# Patient Record
Sex: Female | Born: 1945
Health system: Southern US, Community
[De-identification: ages and names within clinical notes are randomized; demographics above are authoritative.]

## PROBLEM LIST (undated history)

## (undated) DIAGNOSIS — J449 Chronic obstructive pulmonary disease, unspecified: Secondary | ICD-10-CM

## (undated) DIAGNOSIS — F32A Depression, unspecified: Secondary | ICD-10-CM

## (undated) DIAGNOSIS — M1990 Unspecified osteoarthritis, unspecified site: Secondary | ICD-10-CM

## (undated) DIAGNOSIS — R0600 Dyspnea, unspecified: Secondary | ICD-10-CM

## (undated) DIAGNOSIS — I10 Essential (primary) hypertension: Secondary | ICD-10-CM

## (undated) DIAGNOSIS — F329 Major depressive disorder, single episode, unspecified: Secondary | ICD-10-CM

## (undated) DIAGNOSIS — E785 Hyperlipidemia, unspecified: Secondary | ICD-10-CM

## (undated) HISTORY — PX: DILATION AND CURETTAGE OF UTERUS: SHX78

---
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
## 1973-12-07 HISTORY — PX: APPENDECTOMY: SHX54

## 1973-12-07 HISTORY — PX: TUBAL LIGATION: SHX77

## 1998-06-26 ENCOUNTER — Observation Stay (HOSPITAL_COMMUNITY): Admission: RE | Admit: 1998-06-26 | Discharge: 1998-06-27 | Payer: Self-pay | Admitting: *Deleted

## 1998-12-07 HISTORY — PX: RENAL ARTERY STENT: SHX2321

## 2003-12-08 HISTORY — PX: WRIST SURGERY: SHX841

## 2009-12-07 HISTORY — PX: BACK SURGERY: SHX140

## 2010-10-22 ENCOUNTER — Inpatient Hospital Stay (HOSPITAL_COMMUNITY): Admission: RE | Admit: 2010-10-22 | Discharge: 2010-10-23 | Payer: Self-pay | Admitting: Neurosurgery

## 2011-02-17 LAB — BASIC METABOLIC PANEL
CO2: 28 mEq/L (ref 19–32)
Chloride: 107 mEq/L (ref 96–112)
Creatinine, Ser: 0.99 mg/dL (ref 0.4–1.2)
GFR calc Af Amer: >60 mL/min (ref >60)
Sodium: 140 mEq/L (ref 135–145)

## 2011-02-17 LAB — URINALYSIS, ROUTINE W REFLEX MICROSCOPIC
Bilirubin Urine: NEGATIVE
Glucose, UA: NEGATIVE mg/dL
Ketones, ur: NEGATIVE mg/dL
Protein, ur: NEGATIVE mg/dL
pH: 6 (ref 5.0–8.0)

## 2011-02-17 LAB — CBC
Hemoglobin: 12.8 g/dL (ref 12.0–15.0)
MCH: 31.9 pg (ref 26.0–34.0)
MCV: 94.3 fL (ref 78.0–100.0)
Platelets: 179 10*3/uL (ref 150–400)
RBC: 4.01 MIL/uL (ref 3.87–5.11)
WBC: 8.5 10*3/uL (ref 4.0–10.5)

## 2011-02-17 LAB — URINE MICROSCOPIC-ADD ON

## 2011-02-17 LAB — SURGICAL PCR SCREEN
MRSA, PCR: NEGATIVE
Staphylococcus aureus: NEGATIVE

## 2011-02-17 LAB — PROTIME-INR: Prothrombin Time: 12.8 seconds (ref 11.6–15.2)

## 2011-02-17 LAB — APTT: aPTT: 32 seconds (ref 24–37)

## 2013-08-22 ENCOUNTER — Encounter (HOSPITAL_COMMUNITY): Payer: Self-pay | Admitting: Pharmacy Technician

## 2013-08-22 NOTE — Patient Instructions (Signed)
Your procedure is scheduled on:  Monday, 08/28/13  Report to [facility] at     0930   AM.  Call this number if you have problems the morning of surgery: [phone]   Remember:   Do not eat or drink   After Midnight.  Take these medicines the morning of surgery with A SIP OF WATER: xanax, exforge, and celexa   Do not wear jewelry, make-up or nail polish.  Do not wear lotions, powders, or perfumes. You may wear deodorant.  Do not bring valuables to the hospital.  Contacts, dentures or bridgework may not be worn into surgery.     Patients discharged the day of surgery will not be allowed to drive home.  Name and phone number of your driver: driver  Special Instructions: Use eye drops as directed.   Please read over the following fact sheets that you were given: Pain Booklet, Anesthesia Post-op Instructions and Care and Recovery After Surgery    Cataract Surgery  A cataract is a clouding of the lens of the eye. When a lens becomes cloudy, vision is reduced based on the degree and nature of the clouding. Surgery may be needed to improve vision. Surgery removes the cloudy lens and usually replaces it with a substitute lens (intraocular lens, IOL). LET YOUR EYE DOCTOR KNOW ABOUT:  Allergies to food or medicine.   Medicines taken including herbs, eyedrops, over-the-counter medicines, and creams.   Use of steroids (by mouth or creams).   Previous problems with anesthetics or numbing medicine.   History of bleeding problems or blood clots.   Previous surgery.   Other health problems, including diabetes and kidney problems.   Possibility of pregnancy, if this applies.  RISKS AND COMPLICATIONS  Infection.   Inflammation of the eyeball (endophthalmitis) that can spread to both eyes (sympathetic ophthalmia).   Poor wound healing.   If an IOL is inserted, it can later fall out of proper position. This is very uncommon.   Clouding of the part of your eye that holds an IOL in place.  This is called an "after-cataract." These are uncommon, but easily treated.  BEFORE THE PROCEDURE  Do not eat or drink anything except small amounts of water for 8 to 12 before your surgery, or as directed by your caregiver.   Unless you are told otherwise, continue any eyedrops you have been prescribed.   Talk to your primary caregiver about all other medicines that you take (both prescription and non-prescription). In some cases, you may need to stop or change medicines near the time of your surgery. This is most important if you are taking blood-thinning medicine.Do not stop medicines unless you are told to do so.   Arrange for someone to drive you to and from the procedure.   Do not put contact lenses in either eye on the day of your surgery.  PROCEDURE There is more than one method for safely removing a cataract. Your doctor can explain the differences and help determine which is best for you. Phacoemulsification surgery is the most common form of cataract surgery.  An injection is given behind the eye or eyedrops are given to make this a painless procedure.   A small cut (incision) is made on the edge of the clear, dome-shaped surface that covers the front of the eye (cornea).   A tiny probe is painlessly inserted into the eye. This device gives off ultrasound waves that soften and break up the cloudy center of the lens.  This makes it easier for the cloudy lens to be removed by suction.   An IOL may be implanted.   The normal lens of the eye is covered by a clear capsule. Part of that capsule is intentionally left in the eye to support the IOL.   Your surgeon may or may not use stitches to close the incision.  There are other forms of cataract surgery that require a larger incision and stiches to close the eye. This approach is taken in cases where the doctor feels that the cataract cannot be easily removed using phacoemulsification. AFTER THE PROCEDURE  When an IOL is  implanted, it does not need care. It becomes a permanent part of your eye and cannot be seen or felt.   Your doctor will schedule follow-up exams to check on your progress.   Review your other medicines with your doctor to see which can be resumed after surgery.   Use eyedrops or take medicine as prescribed by your doctor.  Document Released: 11/12/2011 Document Reviewed: 11/09/2011 [facility] Patient Information 2012 [place], [place].  PATIENT INSTRUCTIONS POST-ANESTHESIA  IMMEDIATELY FOLLOWING SURGERY:  Do not drive or operate machinery for the first twenty four hours after surgery.  Do not make any important decisions for twenty four hours after surgery or while taking narcotic pain medications or sedatives.  If you develop intractable nausea and vomiting or a severe headache please notify your doctor immediately.  FOLLOW-UP:  Please make an appointment with your surgeon as instructed. You do not need to follow up with anesthesia unless specifically instructed to do so.  WOUND CARE INSTRUCTIONS (if applicable):  Keep a dry clean dressing on the anesthesia/puncture wound site if there is drainage.  Once the wound has quit draining you may leave it open to air.  Generally you should leave the bandage intact for twenty four hours unless there is drainage.  If the epidural site drains for more than 36-48 hours please call the anesthesia department.  QUESTIONS?:  Please feel free to call your physician or the hospital operator if you have any questions, and they will be happy to assist you.

## 2013-08-23 ENCOUNTER — Encounter (HOSPITAL_COMMUNITY)
Admission: RE | Admit: 2013-08-23 | Discharge: 2013-08-23 | Disposition: A | Discharge status: Home / Self Care | Payer: Medicare Other | Source: Ambulatory Visit | Attending: Ophthalmology | Admitting: Ophthalmology

## 2013-08-23 ENCOUNTER — Encounter (HOSPITAL_COMMUNITY): Payer: Self-pay

## 2013-08-23 DIAGNOSIS — Z01810 Encounter for preprocedural cardiovascular examination: Secondary | ICD-10-CM | POA: Insufficient documentation

## 2013-08-23 DIAGNOSIS — Z01812 Encounter for preprocedural laboratory examination: Secondary | ICD-10-CM | POA: Insufficient documentation

## 2013-08-23 DIAGNOSIS — Z01818 Encounter for other preprocedural examination: Secondary | ICD-10-CM | POA: Insufficient documentation

## 2013-08-23 HISTORY — DX: Essential (primary) hypertension: I10

## 2013-08-23 HISTORY — DX: Hyperlipidemia, unspecified: E78.5

## 2013-08-23 HISTORY — DX: Major depressive disorder, single episode, unspecified: F32.9

## 2013-08-23 HISTORY — DX: Depression, unspecified: F32.A

## 2013-08-23 LAB — HEMOGLOBIN AND HEMATOCRIT, BLOOD
HCT: 34.8 % — ABNORMAL LOW (ref 36.0–46.0)
Hemoglobin: 11.8 g/dL — ABNORMAL LOW (ref 12.0–15.0)

## 2013-08-23 LAB — BASIC METABOLIC PANEL
CO2: 29 mEq/L (ref 19–32)
Calcium: 9.6 mg/dL (ref 8.4–10.5)
Creatinine, Ser: 1.17 mg/dL — ABNORMAL HIGH (ref 0.50–1.10)
GFR calc non Af Amer: 47 mL/min — ABNORMAL LOW (ref >90)
Glucose, Bld: 96 mg/dL (ref 70–99)
Sodium: 140 mEq/L (ref 135–145)

## 2013-08-28 ENCOUNTER — Ambulatory Visit (HOSPITAL_COMMUNITY): Payer: Medicare Other | Admitting: Anesthesiology

## 2013-08-28 ENCOUNTER — Encounter (HOSPITAL_COMMUNITY): Payer: Self-pay | Admitting: Anesthesiology

## 2013-08-28 ENCOUNTER — Encounter (HOSPITAL_COMMUNITY): Payer: Self-pay | Admitting: *Deleted

## 2013-08-28 ENCOUNTER — Encounter (HOSPITAL_COMMUNITY)
Admission: RE | Disposition: A | Discharge status: Home / Self Care | Payer: Self-pay | Source: Ambulatory Visit | Attending: Ophthalmology

## 2013-08-28 ENCOUNTER — Ambulatory Visit (HOSPITAL_COMMUNITY)
Admission: RE | Admit: 2013-08-28 | Discharge: 2013-08-28 | Disposition: A | Discharge status: Home / Self Care | Payer: Medicare Other | Source: Ambulatory Visit | Attending: Ophthalmology | Admitting: Ophthalmology

## 2013-08-28 DIAGNOSIS — H2510 Age-related nuclear cataract, unspecified eye: Secondary | ICD-10-CM | POA: Insufficient documentation

## 2013-08-28 DIAGNOSIS — I10 Essential (primary) hypertension: Secondary | ICD-10-CM | POA: Insufficient documentation

## 2013-08-28 HISTORY — PX: CATARACT EXTRACTION W/PHACO: SHX586

## 2013-08-28 SURGERY — PHACOEMULSIFICATION, CATARACT, WITH IOL INSERTION
Anesthesia: Monitor Anesthesia Care | Site: Eye | Laterality: Right | Wound class: Clean

## 2013-08-28 MED ORDER — TETRACAINE HCL 0.5 % OP SOLN
1.0000 [drp] | OPHTHALMIC | Status: AC
  Administered 2013-08-28 (×3): 1 [drp] via OPHTHALMIC

## 2013-08-28 MED ORDER — FENTANYL CITRATE 0.05 MG/ML IJ SOLN
25.0000 ug | INTRAMUSCULAR | Status: AC
  Administered 2013-08-28 (×2): 25 ug via INTRAVENOUS

## 2013-08-28 MED ORDER — MIDAZOLAM HCL 2 MG/2ML IJ SOLN
1.0000 mg | INTRAMUSCULAR | Status: DC | PRN
  Administered 2013-08-28: 2 mg via INTRAVENOUS

## 2013-08-28 MED ORDER — EPINEPHRINE HCL 1 MG/ML IJ SOLN
INTRAOCULAR | Status: DC | PRN
  Administered 2013-08-28: 11:00:00

## 2013-08-28 MED ORDER — ONDANSETRON HCL 4 MG/2ML IJ SOLN: 4.0000 mg | Freq: Once | INTRAMUSCULAR | Status: DC | PRN

## 2013-08-28 MED ORDER — CYCLOPENTOLATE-PHENYLEPHRINE 0.2-1 % OP SOLN
1.0000 [drp] | OPHTHALMIC | Status: AC
  Administered 2013-08-28 (×3): 1 [drp] via OPHTHALMIC

## 2013-08-28 MED ORDER — LIDOCAINE HCL (PF) 1 % IJ SOLN
INTRAMUSCULAR | Status: DC | PRN
  Administered 2013-08-28: .6 mL

## 2013-08-28 MED ORDER — PHENYLEPHRINE HCL 2.5 % OP SOLN
1.0000 [drp] | OPHTHALMIC | Status: AC
  Administered 2013-08-28 (×3): 1 [drp] via OPHTHALMIC

## 2013-08-28 MED ORDER — FENTANYL CITRATE 0.05 MG/ML IJ SOLN
INTRAMUSCULAR | Status: AC
  Filled 2013-08-28: qty 2

## 2013-08-28 MED ORDER — LIDOCAINE HCL 3.5 % OP GEL
1.0000 "application " | Freq: Once | OPHTHALMIC | Status: AC
  Administered 2013-08-28: 1 via OPHTHALMIC

## 2013-08-28 MED ORDER — PROVISC 10 MG/ML IO SOLN
INTRAOCULAR | Status: DC | PRN
  Administered 2013-08-28: 8.5 mg via INTRAOCULAR

## 2013-08-28 MED ORDER — POVIDONE-IODINE 5 % OP SOLN
OPHTHALMIC | Status: DC | PRN
  Administered 2013-08-28: 1 via OPHTHALMIC

## 2013-08-28 MED ORDER — LACTATED RINGERS IV SOLN
INTRAVENOUS | Status: DC
  Administered 2013-08-28: 10:00:00 via INTRAVENOUS

## 2013-08-28 MED ORDER — MIDAZOLAM HCL 2 MG/2ML IJ SOLN
INTRAMUSCULAR | Status: AC
  Filled 2013-08-28: qty 2

## 2013-08-28 MED ORDER — EPINEPHRINE HCL 1 MG/ML IJ SOLN
INTRAMUSCULAR | Status: AC
  Filled 2013-08-28: qty 1

## 2013-08-28 MED ORDER — BSS IO SOLN
INTRAOCULAR | Status: DC | PRN
  Administered 2013-08-28: 15 mL via INTRAOCULAR

## 2013-08-28 MED ORDER — LIDOCAINE 3.5 % OP GEL OPTIME - NO CHARGE
OPHTHALMIC | Status: DC | PRN
  Administered 2013-08-28: 2 [drp] via OPHTHALMIC

## 2013-08-28 MED ORDER — FENTANYL CITRATE 0.05 MG/ML IJ SOLN: 25.0000 ug | INTRAMUSCULAR | Status: DC | PRN

## 2013-08-28 SURGICAL SUPPLY — 32 items
CAPSULAR TENSION RING-AMO (OPHTHALMIC RELATED) IMPLANT
CLOTH BEACON ORANGE TIMEOUT ST (SAFETY) ×2 IMPLANT
EYE SHIELD UNIVERSAL CLEAR (GAUZE/BANDAGES/DRESSINGS) ×2 IMPLANT
GLOVE BIO SURGEON STRL SZ 6.5 (GLOVE) IMPLANT
GLOVE BIOGEL PI IND STRL 6.5 (GLOVE) ×1 IMPLANT
GLOVE BIOGEL PI IND STRL 7.0 (GLOVE) IMPLANT
GLOVE BIOGEL PI IND STRL 7.5 (GLOVE) IMPLANT
GLOVE BIOGEL PI INDICATOR 6.5 (GLOVE) ×1
GLOVE BIOGEL PI INDICATOR 7.0 (GLOVE)
GLOVE BIOGEL PI INDICATOR 7.5 (GLOVE)
GLOVE ECLIPSE 6.5 STRL STRAW (GLOVE) IMPLANT
GLOVE ECLIPSE 7.0 STRL STRAW (GLOVE) IMPLANT
GLOVE ECLIPSE 7.5 STRL STRAW (GLOVE) IMPLANT
GLOVE EXAM NITRILE LRG STRL (GLOVE) ×2 IMPLANT
GLOVE EXAM NITRILE MD LF STRL (GLOVE) IMPLANT
GLOVE SKINSENSE NS SZ6.5 (GLOVE)
GLOVE SKINSENSE NS SZ7.0 (GLOVE)
GLOVE SKINSENSE STRL SZ6.5 (GLOVE) IMPLANT
GLOVE SKINSENSE STRL SZ7.0 (GLOVE) IMPLANT
KIT VITRECTOMY (OPHTHALMIC RELATED) IMPLANT
PAD ARMBOARD 7.5X6 YLW CONV (MISCELLANEOUS) ×2 IMPLANT
PROC W NO LENS (INTRAOCULAR LENS)
PROC W SPEC LENS (INTRAOCULAR LENS)
PROCESS W NO LENS (INTRAOCULAR LENS) IMPLANT
PROCESS W SPEC LENS (INTRAOCULAR LENS) IMPLANT
RING MALYGIN (MISCELLANEOUS) IMPLANT
SIGHTPATH CAT PROC W REG LENS (Ophthalmic Related) ×2 IMPLANT
SYR TB 1ML LL NO SAFETY (SYRINGE) ×2 IMPLANT
TAPE SURG TRANSPORE 1 IN (GAUZE/BANDAGES/DRESSINGS) ×1 IMPLANT
TAPE SURGICAL TRANSPORE 1 IN (GAUZE/BANDAGES/DRESSINGS) ×1
VISCOELASTIC ADDITIONAL (OPHTHALMIC RELATED) IMPLANT
WATER STERILE IRR 250ML POUR (IV SOLUTION) ×2 IMPLANT

## 2013-08-28 NOTE — H&P (Signed)
I have reviewed the H&P, the patient was re-examined, and I have identified no interval changes in medical condition and plan of care since the history and physical of record  

## 2013-08-28 NOTE — Preoperative (Signed)
Beta Blockers   Reason not to administer Beta Blockers:Not Applicable 

## 2013-08-28 NOTE — Transfer of Care (Signed)
Immediate Anesthesia Transfer of Care Note  Patient: Nicole Nash  Procedure(s) Performed: Procedure(s) with comments: CATARACT EXTRACTION PHACO AND INTRAOCULAR LENS PLACEMENT (IOC) (Right) - CDE:  9.15  Patient Location: Short Stay  Anesthesia Type:MAC  Level of Consciousness: awake, alert , oriented and patient cooperative  Airway & Oxygen Therapy: Patient Spontanous Breathing  Post-op Assessment: Report given to PACU RN and Post -op Vital signs reviewed and stable  Post vital signs: Reviewed and stable  Complications: No apparent anesthesia complications

## 2013-08-28 NOTE — Op Note (Signed)
Date of Admission: 08/28/2013  Date of Surgery: 08/28/2013  Pre-Op Dx: Cataract  Right  Eye  Post-Op Dx: Nuclear Cataract  Right  Eye,  Dx Code 366.16  Surgeon: Gemma Payor, M.D.  Assistants: None  Anesthesia: Topical with MAC  Indications: Painless, progressive loss of vision with compromise of daily activities.  Surgery: Cataract Extraction with Intraocular lens Implant Right Eye  Discription: The patient had dilating drops and viscous lidocaine placed into the right eye in the pre-op holding area. After transfer to the operating room, a time out was performed. The patient was then prepped and draped. Beginning with a 75 degree blade a paracentesis port was made at the surgeon's 2 o'clock position. The anterior chamber was then filled with 1% non-preserved lidocaine. This was followed by filling the anterior chamber with Provisc.  A 2.7mm keratome blade was used to make a clear corneal incision at the temporal limbus.  A bent cystatome needle was used to create a continuous tear capsulotomy. Hydrodissection was performed with balanced salt solution on a Fine canula. The lens nucleus was then removed using the phacoemulsification handpiece. Residual cortex was removed with the I&A handpiece. The anterior chamber and capsular bag were refilled with Provisc. A posterior chamber intraocular lens was placed into the capsular bag with it's injector. The implant was positioned with the Kuglan hook. The Provisc was then removed from the anterior chamber and capsular bag with the I&A handpiece. Stromal hydration of the main incision and paracentesis port was performed with BSS on a Fine canula. The wounds were tested for leak which was negative. The patient tolerated the procedure well. There were no operative complications. The patient was then transferred to the recovery room in stable condition.  Complications: None  Specimen: None  EBL: None  Prosthetic device: B&L enVista, MX60, power 24.0D, SN  [id number].

## 2013-08-28 NOTE — Anesthesia Postprocedure Evaluation (Signed)
  Anesthesia Post-op Note  Patient: Nicole Nash  Procedure(s) Performed: Procedure(s) with comments: CATARACT EXTRACTION PHACO AND INTRAOCULAR LENS PLACEMENT (IOC) (Right) - CDE:  9.15  Patient Location: Short Stay  Anesthesia Type:MAC  Level of Consciousness: awake, alert , oriented and patient cooperative  Airway and Oxygen Therapy: Patient Spontanous Breathing  Post-op Pain: none  Post-op Assessment: Post-op Vital signs reviewed, Patient's Cardiovascular Status Stable, Respiratory Function Stable, Patent Airway, No signs of Nausea or vomiting and Pain level controlled  Post-op Vital Signs: Reviewed and stable  Complications: No apparent anesthesia complications

## 2013-08-28 NOTE — Anesthesia Procedure Notes (Signed)
Procedure Name: MAC Date/Time: 08/28/2013 10:51 AM Performed by: Carolyne Littles, AMY L Pre-anesthesia Checklist: Timeout performed, Patient identified, Emergency Drugs available, Suction available and Patient being monitored Oxygen Delivery Method: Nasal cannula

## 2013-08-28 NOTE — Anesthesia Preprocedure Evaluation (Signed)
Anesthesia Evaluation  Patient identified by MRN, date of birth, ID band Patient awake    Reviewed: Allergy & Precautions, H&P , NPO status , Patient's Chart, lab work & pertinent test results  Airway Mallampati: II      Dental  (+) Edentulous Upper   Pulmonary former smoker,  breath sounds clear to auscultation        Cardiovascular hypertension, Pt. on medications Rhythm:Regular Rate:Normal     Neuro/Psych PSYCHIATRIC DISORDERS Depression    GI/Hepatic   Endo/Other    Renal/GU      Musculoskeletal   Abdominal   Peds  Hematology   Anesthesia Other Findings   Reproductive/Obstetrics                           Anesthesia Physical Anesthesia Plan  ASA: II  Anesthesia Plan: MAC   Post-op Pain Management:    Induction: Intravenous  Airway Management Planned: Nasal Cannula  Additional Equipment:   Intra-op Plan:   Post-operative Plan:   Informed Consent: I have reviewed the patients History and Physical, chart, labs and discussed the procedure including the risks, benefits and alternatives for the proposed anesthesia with the patient or authorized representative who has indicated his/her understanding and acceptance.     Plan Discussed with:   Anesthesia Plan Comments:         Anesthesia Quick Evaluation

## 2013-08-29 ENCOUNTER — Encounter (HOSPITAL_COMMUNITY): Payer: Self-pay | Admitting: Ophthalmology

## 2013-08-29 MED ORDER — NEOMYCIN-POLYMYXIN-DEXAMETH 0.1 % OP OINT
TOPICAL_OINTMENT | OPHTHALMIC | Status: DC | PRN
  Administered 2013-08-28: 1 via OPHTHALMIC

## 2013-09-05 ENCOUNTER — Encounter (HOSPITAL_COMMUNITY): Payer: Self-pay | Admitting: Pharmacy Technician

## 2013-09-06 ENCOUNTER — Encounter (HOSPITAL_COMMUNITY)
Admission: RE | Admit: 2013-09-06 | Discharge: 2013-09-06 | Disposition: A | Discharge status: Home / Self Care | Payer: Medicare Other | Source: Ambulatory Visit | Attending: Ophthalmology | Admitting: Ophthalmology

## 2013-09-06 MED ORDER — TETRACAINE HCL 0.5 % OP SOLN
OPHTHALMIC | Status: AC
  Filled 2013-09-06: qty 2

## 2013-09-06 MED ORDER — NEOMYCIN-POLYMYXIN-DEXAMETH 3.5-10000-0.1 OP SUSP
OPHTHALMIC | Status: AC
  Filled 2013-09-06: qty 5

## 2013-09-06 MED ORDER — LIDOCAINE HCL (PF) 1 % IJ SOLN
INTRAMUSCULAR | Status: AC
  Filled 2013-09-06: qty 2

## 2013-09-06 MED ORDER — CYCLOPENTOLATE-PHENYLEPHRINE OP SOLN OPTIME - NO CHARGE
OPHTHALMIC | Status: AC
  Filled 2013-09-06: qty 2

## 2013-09-06 MED ORDER — LIDOCAINE HCL 3.5 % OP GEL
OPHTHALMIC | Status: AC
  Filled 2013-09-06: qty 1

## 2013-09-07 ENCOUNTER — Encounter (HOSPITAL_COMMUNITY): Payer: Self-pay | Admitting: *Deleted

## 2013-09-07 ENCOUNTER — Ambulatory Visit (HOSPITAL_COMMUNITY): Payer: Medicare Other | Admitting: Anesthesiology

## 2013-09-07 ENCOUNTER — Encounter (HOSPITAL_COMMUNITY)
Admission: RE | Disposition: A | Discharge status: Home / Self Care | Payer: Self-pay | Source: Ambulatory Visit | Attending: Ophthalmology

## 2013-09-07 ENCOUNTER — Encounter (HOSPITAL_COMMUNITY): Payer: Self-pay | Admitting: Anesthesiology

## 2013-09-07 ENCOUNTER — Ambulatory Visit (HOSPITAL_COMMUNITY)
Admission: RE | Admit: 2013-09-07 | Discharge: 2013-09-07 | Disposition: A | Discharge status: Home / Self Care | Payer: Medicare Other | Source: Ambulatory Visit | Attending: Ophthalmology | Admitting: Ophthalmology

## 2013-09-07 DIAGNOSIS — I10 Essential (primary) hypertension: Secondary | ICD-10-CM | POA: Insufficient documentation

## 2013-09-07 DIAGNOSIS — H2510 Age-related nuclear cataract, unspecified eye: Secondary | ICD-10-CM | POA: Insufficient documentation

## 2013-09-07 HISTORY — PX: CATARACT EXTRACTION W/PHACO: SHX586

## 2013-09-07 SURGERY — PHACOEMULSIFICATION, CATARACT, WITH IOL INSERTION
Anesthesia: Monitor Anesthesia Care | Site: Eye | Laterality: Left | Wound class: Clean

## 2013-09-07 MED ORDER — LACTATED RINGERS IV SOLN
INTRAVENOUS | Status: DC
  Administered 2013-09-07: 1000 mL via INTRAVENOUS

## 2013-09-07 MED ORDER — CYCLOPENTOLATE-PHENYLEPHRINE 0.2-1 % OP SOLN
1.0000 [drp] | OPHTHALMIC | Status: AC
  Administered 2013-09-07 (×3): 1 [drp] via OPHTHALMIC

## 2013-09-07 MED ORDER — FENTANYL CITRATE 0.05 MG/ML IJ SOLN
25.0000 ug | INTRAMUSCULAR | Status: AC
  Administered 2013-09-07: 25 ug via INTRAVENOUS
  Administered 2013-09-07: 13:00:00 via INTRAVENOUS

## 2013-09-07 MED ORDER — NEOMYCIN-POLYMYXIN-DEXAMETH 0.1 % OP OINT
TOPICAL_OINTMENT | OPHTHALMIC | Status: DC | PRN
  Administered 2013-09-07: 1 via OPHTHALMIC

## 2013-09-07 MED ORDER — LIDOCAINE 3.5 % OP GEL OPTIME - NO CHARGE
OPHTHALMIC | Status: DC | PRN
  Administered 2013-09-07: 1 [drp] via OPHTHALMIC

## 2013-09-07 MED ORDER — FENTANYL CITRATE 0.05 MG/ML IJ SOLN
INTRAMUSCULAR | Status: AC
  Filled 2013-09-07: qty 2

## 2013-09-07 MED ORDER — PHENYLEPHRINE HCL 2.5 % OP SOLN
OPHTHALMIC | Status: AC
  Filled 2013-09-07: qty 15

## 2013-09-07 MED ORDER — LIDOCAINE HCL 3.5 % OP GEL
1.0000 "application " | Freq: Once | OPHTHALMIC | Status: AC
  Administered 2013-09-07: 1 via OPHTHALMIC

## 2013-09-07 MED ORDER — EPINEPHRINE HCL 1 MG/ML IJ SOLN
INTRAOCULAR | Status: DC | PRN
  Administered 2013-09-07: 14:00:00

## 2013-09-07 MED ORDER — POVIDONE-IODINE 5 % OP SOLN
OPHTHALMIC | Status: DC | PRN
  Administered 2013-09-07: 1 via OPHTHALMIC

## 2013-09-07 MED ORDER — MIDAZOLAM HCL 2 MG/2ML IJ SOLN
INTRAMUSCULAR | Status: AC
  Filled 2013-09-07: qty 2

## 2013-09-07 MED ORDER — LIDOCAINE HCL (PF) 1 % IJ SOLN
INTRAMUSCULAR | Status: DC | PRN
  Administered 2013-09-07: .7 mL

## 2013-09-07 MED ORDER — PROVISC 10 MG/ML IO SOLN
INTRAOCULAR | Status: DC | PRN
  Administered 2013-09-07: 8.5 mg via INTRAOCULAR

## 2013-09-07 MED ORDER — BSS IO SOLN
INTRAOCULAR | Status: DC | PRN
  Administered 2013-09-07: 15 mL via INTRAOCULAR

## 2013-09-07 MED ORDER — MIDAZOLAM HCL 2 MG/2ML IJ SOLN
1.0000 mg | INTRAMUSCULAR | Status: DC | PRN
  Administered 2013-09-07: 2 mg via INTRAVENOUS

## 2013-09-07 MED ORDER — TETRACAINE HCL 0.5 % OP SOLN
1.0000 [drp] | OPHTHALMIC | Status: AC
  Administered 2013-09-07 (×3): 1 [drp] via OPHTHALMIC

## 2013-09-07 MED ORDER — PHENYLEPHRINE HCL 2.5 % OP SOLN
1.0000 [drp] | OPHTHALMIC | Status: AC
  Administered 2013-09-07 (×3): 1 [drp] via OPHTHALMIC

## 2013-09-07 SURGICAL SUPPLY — 32 items
CAPSULAR TENSION RING-AMO (OPHTHALMIC RELATED) IMPLANT
CLOTH BEACON ORANGE TIMEOUT ST (SAFETY) ×2 IMPLANT
EYE SHIELD UNIVERSAL CLEAR (GAUZE/BANDAGES/DRESSINGS) ×2 IMPLANT
GLOVE BIO SURGEON STRL SZ 6.5 (GLOVE) IMPLANT
GLOVE BIOGEL PI IND STRL 6.5 (GLOVE) ×1 IMPLANT
GLOVE BIOGEL PI IND STRL 7.0 (GLOVE) IMPLANT
GLOVE BIOGEL PI IND STRL 7.5 (GLOVE) IMPLANT
GLOVE BIOGEL PI INDICATOR 6.5 (GLOVE) ×1
GLOVE BIOGEL PI INDICATOR 7.0 (GLOVE)
GLOVE BIOGEL PI INDICATOR 7.5 (GLOVE)
GLOVE ECLIPSE 6.5 STRL STRAW (GLOVE) IMPLANT
GLOVE ECLIPSE 7.0 STRL STRAW (GLOVE) IMPLANT
GLOVE ECLIPSE 7.5 STRL STRAW (GLOVE) IMPLANT
GLOVE EXAM NITRILE LRG STRL (GLOVE) ×2 IMPLANT
GLOVE EXAM NITRILE MD LF STRL (GLOVE) IMPLANT
GLOVE SKINSENSE NS SZ6.5 (GLOVE)
GLOVE SKINSENSE NS SZ7.0 (GLOVE)
GLOVE SKINSENSE STRL SZ6.5 (GLOVE) IMPLANT
GLOVE SKINSENSE STRL SZ7.0 (GLOVE) IMPLANT
KIT VITRECTOMY (OPHTHALMIC RELATED) IMPLANT
PAD ARMBOARD 7.5X6 YLW CONV (MISCELLANEOUS) ×2 IMPLANT
PROC W NO LENS (INTRAOCULAR LENS)
PROC W SPEC LENS (INTRAOCULAR LENS)
PROCESS W NO LENS (INTRAOCULAR LENS) IMPLANT
PROCESS W SPEC LENS (INTRAOCULAR LENS) IMPLANT
RING MALYGIN (MISCELLANEOUS) IMPLANT
SIGHTPATH CAT PROC W REG LENS (Ophthalmic Related) ×2 IMPLANT
SYR TB 1ML LL NO SAFETY (SYRINGE) ×2 IMPLANT
TAPE SURG TRANSPORE 1 IN (GAUZE/BANDAGES/DRESSINGS) ×1 IMPLANT
TAPE SURGICAL TRANSPORE 1 IN (GAUZE/BANDAGES/DRESSINGS) ×1
VISCOELASTIC ADDITIONAL (OPHTHALMIC RELATED) IMPLANT
WATER STERILE IRR 250ML POUR (IV SOLUTION) ×2 IMPLANT

## 2013-09-07 NOTE — Anesthesia Postprocedure Evaluation (Signed)
  Anesthesia Post-op Note  Patient: Nicole Nash  Procedure(s) Performed: Procedure(s) with comments: CATARACT EXTRACTION PHACO AND INTRAOCULAR LENS PLACEMENT (IOC) (Left) - CDE:8.47  Patient Location: Short Stay  Anesthesia Type:MAC  Level of Consciousness: awake, alert , oriented and patient cooperative  Airway and Oxygen Therapy: Patient Spontanous Breathing  Post-op Pain: none  Post-op Assessment: Post-op Vital signs reviewed, Patient's Cardiovascular Status Stable, Respiratory Function Stable, Patent Airway, No signs of Nausea or vomiting and Pain level controlled  Post-op Vital Signs: Reviewed and stable  Complications: No apparent anesthesia complications

## 2013-09-07 NOTE — Transfer of Care (Signed)
Immediate Anesthesia Transfer of Care Note  Patient: Nicole Nash  Procedure(s) Performed: Procedure(s) with comments: CATARACT EXTRACTION PHACO AND INTRAOCULAR LENS PLACEMENT (IOC) (Left) - CDE:8.47  Patient Location: Short Stay  Anesthesia Type:MAC  Level of Consciousness: awake, alert , oriented and patient cooperative  Airway & Oxygen Therapy: Patient Spontanous Breathing  Post-op Assessment: Report given to PACU RN and Post -op Vital signs reviewed and stable  Post vital signs: Reviewed and stable  Complications: No apparent anesthesia complications

## 2013-09-07 NOTE — Preoperative (Signed)
Beta Blockers   Reason not to administer Beta Blockers:Not Applicable 

## 2013-09-07 NOTE — Op Note (Signed)
Date of Admission: 09/07/2013  Date of Surgery: 09/07/2013  Pre-Op Dx: Cataract  Left  Eye  Post-Op Dx: Nuclear Cataract  Left  Eye,  Dx Code 366.16  Surgeon: Gemma Payor, M.D.  Assistants: None  Anesthesia: Topical with MAC  Indications: Painless, progressive loss of vision with compromise of daily activities.  Surgery: Cataract Extraction with Intraocular lens Implant Left Eye  Discription: The patient had dilating drops and viscous lidocaine placed into the left eye in the pre-op holding area. After transfer to the operating room, a time out was performed. The patient was then prepped and draped. Beginning with a 75 degree blade a paracentesis port was made at the surgeon's 2 o'clock position. The anterior chamber was then filled with 1% non-preserved lidocaine. This was followed by filling the anterior chamber with Provisc. A 2.36mm keratome blade was used to make a clear corneal incision at the temporal limbus. A bent cystatome needle was used to create a continuous tear capsulotomy. Hydrodissection was performed with balanced salt solution on a Fine canula. The lens nucleus was then removed using the phacoemulsification handpiece. Residual cortex was removed with the I&A handpiece. The anterior chamber and capsular bag were refilled with Provisc. A posterior chamber intraocular lens was placed into the capsular bag with it's injector. The implant was positioned with the Kuglan hook. The Provisc was then removed from the anterior chamber and capsular bag with the I&A handpiece. Stromal hydration of the main incision and paracentesis port was performed with BSS on a Fine canula. The wounds were tested for leak which was negative. The patient tolerated the procedure well. There were no operative complications. The patient was then transferred to the recovery room in stable condition.  Complications: None  Specimen: None  EBL: None  Prosthetic device: B&L enVista, MX60, power 24.5D, SN  [id number].

## 2013-09-07 NOTE — Anesthesia Procedure Notes (Signed)
Procedure Name: MAC Date/Time: 09/07/2013 1:25 PM Performed by: Carolyne Littles, AMY L Pre-anesthesia Checklist: Patient identified, Timeout performed, Emergency Drugs available, Suction available and Patient being monitored Oxygen Delivery Method: Nasal cannula

## 2013-09-07 NOTE — H&P (Signed)
I have reviewed the H&P, the patient was re-examined, and I have identified no interval changes in medical condition and plan of care since the history and physical of record  

## 2013-09-07 NOTE — Anesthesia Preprocedure Evaluation (Signed)
Anesthesia Evaluation  Patient identified by MRN, date of birth, ID band Patient awake    Reviewed: Allergy & Precautions, H&P , NPO status , Patient's Chart, lab work & pertinent test results, reviewed documented beta blocker date and time   Airway Mallampati: II      Dental  (+) Edentulous Upper   Pulmonary former smoker,  breath sounds clear to auscultation        Cardiovascular hypertension, Pt. on medications Rhythm:Regular Rate:Normal     Neuro/Psych PSYCHIATRIC DISORDERS Depression    GI/Hepatic   Endo/Other    Renal/GU      Musculoskeletal   Abdominal   Peds  Hematology   Anesthesia Other Findings   Reproductive/Obstetrics                           Anesthesia Physical Anesthesia Plan  ASA: II  Anesthesia Plan: MAC   Post-op Pain Management:    Induction: Intravenous  Airway Management Planned: Nasal Cannula  Additional Equipment:   Intra-op Plan:   Post-operative Plan:   Informed Consent: I have reviewed the patients History and Physical, chart, labs and discussed the procedure including the risks, benefits and alternatives for the proposed anesthesia with the patient or authorized representative who has indicated his/her understanding and acceptance.     Plan Discussed with:   Anesthesia Plan Comments:         Anesthesia Quick Evaluation

## 2013-09-11 ENCOUNTER — Encounter (HOSPITAL_COMMUNITY): Payer: Self-pay | Admitting: Ophthalmology

## 2017-10-07 ENCOUNTER — Emergency Department
Admission: EM | Admit: 2017-10-07 | Discharge: 2017-10-07 | Disposition: A | Discharge status: Home / Self Care | Payer: Medicare Other | Attending: Emergency Medicine | Admitting: Emergency Medicine

## 2017-10-07 ENCOUNTER — Emergency Department: Payer: Medicare Other

## 2017-10-07 ENCOUNTER — Encounter: Payer: Self-pay | Admitting: Emergency Medicine

## 2017-10-07 DIAGNOSIS — S0990XA Unspecified injury of head, initial encounter: Secondary | ICD-10-CM | POA: Diagnosis present

## 2017-10-07 DIAGNOSIS — I10 Essential (primary) hypertension: Secondary | ICD-10-CM | POA: Insufficient documentation

## 2017-10-07 DIAGNOSIS — Y929 Unspecified place or not applicable: Secondary | ICD-10-CM | POA: Diagnosis not present

## 2017-10-07 DIAGNOSIS — W0110XA Fall on same level from slipping, tripping and stumbling with subsequent striking against unspecified object, initial encounter: Secondary | ICD-10-CM | POA: Diagnosis not present

## 2017-10-07 DIAGNOSIS — Y999 Unspecified external cause status: Secondary | ICD-10-CM | POA: Diagnosis not present

## 2017-10-07 DIAGNOSIS — S0012XA Contusion of left eyelid and periocular area, initial encounter: Secondary | ICD-10-CM | POA: Insufficient documentation

## 2017-10-07 DIAGNOSIS — Y9301 Activity, walking, marching and hiking: Secondary | ICD-10-CM | POA: Diagnosis not present

## 2017-10-07 DIAGNOSIS — Z87891 Personal history of nicotine dependence: Secondary | ICD-10-CM | POA: Insufficient documentation

## 2017-10-07 DIAGNOSIS — W19XXXA Unspecified fall, initial encounter: Secondary | ICD-10-CM

## 2017-10-07 NOTE — ED Provider Notes (Signed)
[facility] Emergency Department Provider Note       Time seen: ----------------------------------------- 3:43 PM on 10/07/2017 -----------------------------------------     I have reviewed the triage vital signs and the nursing notes.   HISTORY   Chief Complaint Fall    HPI Nicole Nash is a 71 y.o. female with a history of hypertension who presents to the ED for a mechanical fall. Patient states she was walking and she tripped over a concrete parking place divider. She fell striking the left side of her face. She did not have any loss of consciousness but initially felt dazed. She has not had other symptoms of concussion. She does have some abrasions over her knee and left hand but otherwise denies complaints at this time.  Past Medical History:  Diagnosis Date  . Depression   . Hyperlipidemia   . Hypertension     There are no active problems to display for this patient.   Past Surgical History:  Procedure Laterality Date  . APPENDECTOMY  1975  . BACK SURGERY  2011  . CATARACT EXTRACTION W/PHACO Right 08/28/2013   Procedure: CATARACT EXTRACTION PHACO AND INTRAOCULAR LENS PLACEMENT (IOC);  Surgeon: Gemma PayorKerry Hunt, MD;  Location: [facility];  Service: Ophthalmology;  Laterality: Right;  CDE:  9.15  . CATARACT EXTRACTION W/PHACO Left 09/07/2013   Procedure: CATARACT EXTRACTION PHACO AND INTRAOCULAR LENS PLACEMENT (IOC);  Surgeon: Gemma PayorKerry Hunt, MD;  Location: [facility];  Service: Ophthalmology;  Laterality: Left;  CDE:8.47  . NECK SURGERY  1966  . RENAL ARTERY STENT  2000  . TONSILLECTOMY  1958  . TUBAL LIGATION  1975  . WRIST SURGERY Right 2005    Allergies Codeine  Social History Social History  Substance Use Topics  . Smoking status: Former Smoker    Quit date: 08/24/2011  . Smokeless tobacco: Never Used  . Alcohol use No    Review of Systems Constitutional: Negative for fever. Eyes: Negative for vision changes currently ENT:  Negative for  congestion, sore throat Cardiovascular: Negative for chest pain. Respiratory: Negative for shortness of breath. Gastrointestinal: Negative for abdominal pain, vomiting and diarrhea. Genitourinary: Negative for dysuria. Musculoskeletal: Negative for back pain. Skin: Positive for ecchymosis and abrasion Neurological: Negative for headaches, focal weakness or numbness.  All systems negative/normal/unremarkable except as stated in the HPI  ____________________________________________   PHYSICAL EXAM:  VITAL SIGNS: ED Triage Vitals  Enc Vitals Group     BP 10/07/17 1408 (!) 146/57     Pulse Rate 10/07/17 1408 71     Resp 10/07/17 1408 18     Temp 10/07/17 1408 99.2 F (37.3 C)     Temp Source 10/07/17 1408 Oral     SpO2 10/07/17 1408 97 %     Weight 10/07/17 1409 150 lb (68 kg)     Height 10/07/17 1409 5\' 3"  (1.6 m)     Head Circumference --      Peak Flow --      Pain Score 10/07/17 1407 4     Pain Loc --      Pain Edu? --      Excl. in GC? --     Constitutional: Alert and oriented. Well appearing and in no distress. Eyes: Conjunctivae are normal. Normal extraocular movements. ENT   Head: Normocephalic, left infraorbital ecchymosis   Nose: No congestion/rhinnorhea.   Mouth/Throat: Mucous membranes are moist.   Neck: No stridor. Cardiovascular: Normal rate, regular rhythm. No murmurs, rubs, or gallops. Respiratory: Normal respiratory  effort without tachypnea nor retractions. Breath sounds are clear and equal bilaterally. No wheezes/rales/rhonchi. Gastrointestinal: Soft and nontender. Normal bowel sounds Musculoskeletal: Nontender with normal range of motion in extremities. No lower extremity tenderness nor edema. Neurologic:  Normal speech and language. No gross focal neurologic deficits are appreciated.  Skin:  Abrasions are noted over the dorsum of the left hand, left infraorbital ecchymosis Psychiatric: Mood and affect are normal. Speech and behavior are  normal.  ____________________________________________  ED COURSE:  Pertinent labs & imaging results that were available during my care of the patient were reviewed by me and considered in my medical decision making (see chart for details). Patient presents for mechanical fall, we will assess with imaging as indicated.   Procedures ____________________________________________   RADIOLOGY  CT head, max of facial, cervical spine Was unremarkable  ____________________________________________  DIFFERENTIAL DIAGNOSIS   Fall, head injury, concussion, abrasion, contusion, subdural hematoma, skull fracture   FINAL ASSESSMENT AND PLAN  Minor head injury, mechanical fall   Plan: Patient had presented for mechanical fall that occurred just prior to arrival. Or imaging was reassuring, she has a normal neurologic exam and is stable for outpatient follow-up.   Emily Filbert, MD   Note: This note was generated in part or whole with voice recognition software. Voice recognition is usually quite accurate but there are transcription errors that can and very often do occur. I apologize for any typographical errors that were not detected and corrected.     Emily Filbert, MD 10/07/17 [phone]

## 2017-10-07 NOTE — ED Triage Notes (Signed)
Pt tripped over handicap parking block and hit face. Periorbital bruising left eye.  C/o blurry vision to left eye. No LOC. No blood thinners. No neck pain.  No vomiting. Does have headache.  Mild nausea.

## 2018-06-29 ENCOUNTER — Ambulatory Visit (HOSPITAL_COMMUNITY)
Admission: RE | Admit: 2018-06-29 | Discharge: 2018-06-29 | Disposition: A | Discharge status: Home / Self Care | Payer: Medicare Other | Source: Ambulatory Visit | Attending: Nurse Practitioner | Admitting: Nurse Practitioner

## 2018-06-29 ENCOUNTER — Other Ambulatory Visit (HOSPITAL_COMMUNITY): Payer: Self-pay | Admitting: Nurse Practitioner

## 2018-06-29 DIAGNOSIS — R42 Dizziness and giddiness: Secondary | ICD-10-CM | POA: Diagnosis not present

## 2018-07-05 ENCOUNTER — Encounter: Payer: Self-pay | Admitting: Neurology

## 2018-08-24 NOTE — Progress Notes (Signed)
NEUROLOGY CONSULTATION NOTE  Nicole Nash D[place]ore MRN: [id number] DOB: 03/13/1946  Referring provider: Kirstie PeriAshish Shah, MD Primary care provider: Kirstie PeriAshish Shah, MD  Reason for consult:  dizziness  HISTORY OF PRESENT ILLNESS: Nicole Nash Burmaster is a 72 year old female with left subclavian steal syndrome, hypertension, hyperlipidemia and depression who presents for dizziness.  History supplemented by referring provider's note.  She began having intermittent dizziness in July.  No preceding event.  She describes it as spinning sensation.  It typically lasts 30 to 45 seconds up to 1-2 minutes.  It is associated with nausea if severe.  It is not associated with vomiting, diplopia, dysphagia, hearing loss or unilateral numbness and weakness.  Rarely, she notes tinnitus.  It is triggered by change in position such as bending over, laying down, rolling over in bed, head turning or standing quickly.  It daily, up ot 5 times a day.  She denies prior episodes of vertigo.  She takes meclizine which provides short relief.  She has tried and failed prednisone and antibiotics.   For further evaluation, she had a CT of the head without contrast on 06/29/18, which was personally reviewed and demonstrated chronic small vessel ischemic changes, stable compared to prior exam on 10/07/17, but no acute findings.  PAST MEDICAL HISTORY: Past Medical History:  Diagnosis Date  . Depression   . Hyperlipidemia   . Hypertension     PAST SURGICAL HISTORY: Past Surgical History:  Procedure Laterality Date  . APPENDECTOMY  1975  . BACK SURGERY  2011  . CATARACT EXTRACTION W/PHACO Right 08/28/2013   Procedure: CATARACT EXTRACTION PHACO AND INTRAOCULAR LENS PLACEMENT (IOC);  Surgeon: Gemma PayorKerry Hunt, MD;  Location: [facility];  Service: Ophthalmology;  Laterality: Right;  CDE:  9.15  . CATARACT EXTRACTION W/PHACO Left 09/07/2013   Procedure: CATARACT EXTRACTION PHACO AND INTRAOCULAR LENS PLACEMENT (IOC);  Surgeon: Gemma PayorKerry Hunt, MD;   Location: [facility];  Service: Ophthalmology;  Laterality: Left;  CDE:8.47  . NECK SURGERY  1966  . RENAL ARTERY STENT  2000  . TONSILLECTOMY  1958  . TUBAL LIGATION  1975  . WRIST SURGERY Right 2005    MEDICATIONS: Current Outpatient Medications on File Prior to Visit  Medication Sig Dispense Refill  . ALPRAZolam (XANAX) 0.25 MG tablet Take 0.25 mg by mouth 2 (two) times daily.    Marland Kitchen. amLODipine-valsartan (EXFORGE) 5-160 MG per tablet Take 1 tablet by mouth daily.    Marland Kitchen. aspirin 325 MG tablet Take 325 mg by mouth daily.    Marland Kitchen. atorvastatin (LIPITOR) 20 MG tablet Take 20 mg by mouth daily.    Marland Kitchen. BESIVANCE 0.6 % SUSP Apply 1 drop to eye daily.    . Calcium Carbonate-Vitamin D (CALCIUM 600 + D PO) Take 1 tablet by mouth daily.    . citalopram (CELEXA) 40 MG tablet Take 40 mg by mouth daily.    . DUREZOL 0.05 % EMUL Apply 1 drop to eye daily.    . folic acid (FOLVITE) 400 MCG tablet Take 400 mcg by mouth daily.    . Multiple Vitamin (MULTIVITAMIN WITH MINERALS) TABS tablet Take 1 tablet by mouth daily.    Marland Kitchen. PROLENSA 0.07 % SOLN Apply 1 drop to eye daily.    . vitamin E 400 UNIT capsule Take 400 Units by mouth daily.     No current facility-administered medications on file prior to visit.     ALLERGIES: Allergies  Allergen Reactions  . Codeine Anaphylaxis and Shortness Of Breath  FAMILY HISTORY: No family history on file.  SOCIAL HISTORY: Social History   Socioeconomic History  . Marital status: Single    Spouse name: Not on file  . Number of children: Not on file  . Years of education: Not on file  . Highest education level: Not on file  Occupational History  . Not on file  Social Needs  . Financial resource strain: Not on file  . Food insecurity:    Worry: Not on file    Inability: Not on file  . Transportation needs:    Medical: Not on file    Non-medical: Not on file  Tobacco Use  . Smoking status: Former Smoker    Last attempt to quit: 08/24/2011    Years since  quitting: 7.0  . Smokeless tobacco: Never Used  Substance and Sexual Activity  . Alcohol use: No  . Drug use: No  . Sexual activity: Not on file  Lifestyle  . Physical activity:    Days per week: Not on file    Minutes per session: Not on file  . Stress: Not on file  Relationships  . Social connections:    Talks on phone: Not on file    Gets together: Not on file    Attends religious service: Not on file    Active member of club or organization: Not on file    Attends meetings of clubs or organizations: Not on file    Relationship status: Not on file  . Intimate partner violence:    Fear of current or ex partner: Not on file    Emotionally abused: Not on file    Physically abused: Not on file    Forced sexual activity: Not on file  Other Topics Concern  . Not on file  Social History Narrative  . Not on file    REVIEW OF SYSTEMS: Constitutional: No fevers, chills, or sweats, no generalized fatigue, change in appetite Eyes: No visual changes, double vision, eye pain Ear, nose and throat: No hearing loss, ear pain, nasal congestion, sore throat Cardiovascular: No chest pain, palpitations Respiratory:  No shortness of breath at rest or with exertion, wheezes GastrointestinaI: No nausea, vomiting, diarrhea, abdominal pain, fecal incontinence Genitourinary:  No dysuria, urinary retention or frequency Musculoskeletal:  No neck pain, back pain Integumentary: No rash, pruritus, skin lesions Neurological: as above Psychiatric: No depression, insomnia, anxiety Endocrine: No palpitations, fatigue, diaphoresis, mood swings, change in appetite, change in weight, increased thirst Hematologic/Lymphatic:  No purpura, petechiae. Allergic/Immunologic: no itchy/runny eyes, nasal congestion, recent allergic reactions, rashes  PHYSICAL EXAM: Blood pressure (!) 148/72, pulse 85, height 5\' 3"  (1.6 Nash), weight 144 lb (65.3 kg), SpO2 98 %. General: No acute distress.  Patient appears  well-groomed.  Head:  Normocephalic/atraumatic Eyes:  fundi examined but not visualized Neck: supple, no paraspinal tenderness, full range of motion Back: No paraspinal tenderness Heart: regular rate and rhythm Lungs: Clear to auscultation bilaterally. Vascular: No carotid bruits. Neurological Exam: Mental status: alert and oriented to person, place, and time, recent and remote memory intact, fund of knowledge intact, attention and concentration intact, speech fluent and not dysarthric, language intact. Cranial nerves: CN I: not tested CN II: pupils equal, round and reactive to light, visual fields intact CN III, IV, VI:  full range of motion, no nystagmus, no ptosis CN V: facial sensation intact CN VII: upper and lower face symmetric CN VIII: hearing intact CN IX, X: gag intact, uvula midline CN XI: sternocleidomastoid and trapezius muscles  intact CN XII: tongue midline Bulk & Tone: normal, no fasciculations. Motor:  5/5 throughout  Sensation:  temperature and vibration sensation intact. Deep Tendon Reflexes:  2+ throughout, toes downgoing.  Finger to nose testing:  Without dysmetria.  Heel to shin:  Without dysmetria.  Gait:  Normal station and stride.  Able to turn and tandem walk. Romberg negative Head Impulse Test:  Positive on the right.  IMPRESSION: Benign paroxysmal positional vertigo, right sided (may be bilateral).  Positive Head Impulse Test suggests peripheral etiology.   PLAN: 1.  Refer to vestibular rehabilitation 2.  If ineffective, she will contact me and we can proceed with MRI of brain and internal auditory canal w/wo contrast.  Thank you for allowing me to take part in the care of this patient.  Shon Millet, DO  CC: Kirstie Peri, MD

## 2018-08-25 ENCOUNTER — Other Ambulatory Visit: Payer: Self-pay

## 2018-08-25 ENCOUNTER — Ambulatory Visit: Payer: Medicare Other | Admitting: Neurology

## 2018-08-25 ENCOUNTER — Encounter: Payer: Self-pay | Admitting: Neurology

## 2018-08-25 VITALS — BP 148/72 | HR 85 | Ht 63.0 in | Wt 144.0 lb

## 2018-08-25 DIAGNOSIS — H8111 Benign paroxysmal vertigo, right ear: Secondary | ICD-10-CM | POA: Diagnosis not present

## 2018-08-25 NOTE — Patient Instructions (Signed)
I believe that you have benign paroxysmal positional vertigo 1.  I will refer you to physical therapy (vestibular rehabilitation) 2.  If it is ineffective, contact me and we will look into other causes

## 2018-09-06 ENCOUNTER — Telehealth (HOSPITAL_COMMUNITY): Payer: Self-pay | Admitting: Internal Medicine

## 2018-09-06 NOTE — Telephone Encounter (Signed)
09/06/18  pt called and cx said that she would call back.. wanted to see what her dr wants her to do

## 2018-09-07 ENCOUNTER — Ambulatory Visit (HOSPITAL_COMMUNITY): Payer: Medicare Other | Admitting: Physical Therapy

## 2019-12-14 DIAGNOSIS — E7800 Pure hypercholesterolemia, unspecified: Secondary | ICD-10-CM | POA: Diagnosis not present

## 2019-12-14 DIAGNOSIS — I10 Essential (primary) hypertension: Secondary | ICD-10-CM | POA: Diagnosis not present

## 2019-12-14 DIAGNOSIS — J449 Chronic obstructive pulmonary disease, unspecified: Secondary | ICD-10-CM | POA: Diagnosis not present

## 2020-01-03 DIAGNOSIS — I10 Essential (primary) hypertension: Secondary | ICD-10-CM | POA: Diagnosis not present

## 2020-01-30 DIAGNOSIS — J449 Chronic obstructive pulmonary disease, unspecified: Secondary | ICD-10-CM | POA: Diagnosis not present

## 2020-01-30 DIAGNOSIS — E7800 Pure hypercholesterolemia, unspecified: Secondary | ICD-10-CM | POA: Diagnosis not present

## 2020-01-30 DIAGNOSIS — I10 Essential (primary) hypertension: Secondary | ICD-10-CM | POA: Diagnosis not present

## 2020-02-01 DIAGNOSIS — Z20822 Contact with and (suspected) exposure to covid-19: Secondary | ICD-10-CM | POA: Diagnosis not present

## 2020-02-01 DIAGNOSIS — J449 Chronic obstructive pulmonary disease, unspecified: Secondary | ICD-10-CM | POA: Diagnosis not present

## 2020-02-01 DIAGNOSIS — Z7982 Long term (current) use of aspirin: Secondary | ICD-10-CM | POA: Diagnosis not present

## 2020-02-01 DIAGNOSIS — Z79899 Other long term (current) drug therapy: Secondary | ICD-10-CM | POA: Diagnosis not present

## 2020-02-01 DIAGNOSIS — Z886 Allergy status to analgesic agent status: Secondary | ICD-10-CM | POA: Diagnosis not present

## 2020-02-01 DIAGNOSIS — I10 Essential (primary) hypertension: Secondary | ICD-10-CM | POA: Diagnosis not present

## 2020-02-01 DIAGNOSIS — R002 Palpitations: Secondary | ICD-10-CM | POA: Diagnosis not present

## 2020-02-01 DIAGNOSIS — R079 Chest pain, unspecified: Secondary | ICD-10-CM | POA: Diagnosis not present

## 2020-02-01 DIAGNOSIS — E7800 Pure hypercholesterolemia, unspecified: Secondary | ICD-10-CM | POA: Diagnosis not present

## 2020-02-02 DIAGNOSIS — E7800 Pure hypercholesterolemia, unspecified: Secondary | ICD-10-CM | POA: Diagnosis not present

## 2020-02-02 DIAGNOSIS — R079 Chest pain, unspecified: Secondary | ICD-10-CM | POA: Diagnosis not present

## 2020-02-02 DIAGNOSIS — R002 Palpitations: Secondary | ICD-10-CM | POA: Diagnosis not present

## 2020-02-02 DIAGNOSIS — Z20822 Contact with and (suspected) exposure to covid-19: Secondary | ICD-10-CM | POA: Diagnosis not present

## 2020-02-04 DIAGNOSIS — I10 Essential (primary) hypertension: Secondary | ICD-10-CM | POA: Diagnosis not present

## 2020-02-05 DIAGNOSIS — R002 Palpitations: Secondary | ICD-10-CM | POA: Diagnosis not present

## 2020-02-05 DIAGNOSIS — Z299 Encounter for prophylactic measures, unspecified: Secondary | ICD-10-CM | POA: Diagnosis not present

## 2020-02-05 DIAGNOSIS — N183 Chronic kidney disease, stage 3 unspecified: Secondary | ICD-10-CM | POA: Diagnosis not present

## 2020-02-05 DIAGNOSIS — I739 Peripheral vascular disease, unspecified: Secondary | ICD-10-CM | POA: Diagnosis not present

## 2020-02-05 DIAGNOSIS — R079 Chest pain, unspecified: Secondary | ICD-10-CM | POA: Diagnosis not present

## 2020-02-05 DIAGNOSIS — I10 Essential (primary) hypertension: Secondary | ICD-10-CM | POA: Diagnosis not present

## 2020-02-09 DIAGNOSIS — R079 Chest pain, unspecified: Secondary | ICD-10-CM | POA: Diagnosis not present

## 2020-02-12 DIAGNOSIS — J449 Chronic obstructive pulmonary disease, unspecified: Secondary | ICD-10-CM | POA: Diagnosis not present

## 2020-02-12 DIAGNOSIS — I10 Essential (primary) hypertension: Secondary | ICD-10-CM | POA: Diagnosis not present

## 2020-02-12 DIAGNOSIS — E7800 Pure hypercholesterolemia, unspecified: Secondary | ICD-10-CM | POA: Diagnosis not present

## 2020-02-26 DIAGNOSIS — I10 Essential (primary) hypertension: Secondary | ICD-10-CM | POA: Diagnosis not present

## 2020-02-26 DIAGNOSIS — J449 Chronic obstructive pulmonary disease, unspecified: Secondary | ICD-10-CM | POA: Diagnosis not present

## 2020-02-26 DIAGNOSIS — M7552 Bursitis of left shoulder: Secondary | ICD-10-CM | POA: Diagnosis not present

## 2020-02-26 DIAGNOSIS — G4700 Insomnia, unspecified: Secondary | ICD-10-CM | POA: Diagnosis not present

## 2020-02-26 DIAGNOSIS — Z299 Encounter for prophylactic measures, unspecified: Secondary | ICD-10-CM | POA: Diagnosis not present

## 2020-03-05 DIAGNOSIS — I10 Essential (primary) hypertension: Secondary | ICD-10-CM | POA: Diagnosis not present

## 2020-03-07 DIAGNOSIS — R002 Palpitations: Secondary | ICD-10-CM | POA: Diagnosis not present

## 2020-03-11 DIAGNOSIS — R002 Palpitations: Secondary | ICD-10-CM | POA: Diagnosis not present

## 2020-03-14 DIAGNOSIS — F17210 Nicotine dependence, cigarettes, uncomplicated: Secondary | ICD-10-CM | POA: Diagnosis not present

## 2020-03-14 DIAGNOSIS — Z299 Encounter for prophylactic measures, unspecified: Secondary | ICD-10-CM | POA: Diagnosis not present

## 2020-03-14 DIAGNOSIS — I471 Supraventricular tachycardia: Secondary | ICD-10-CM | POA: Diagnosis not present

## 2020-03-14 DIAGNOSIS — E7800 Pure hypercholesterolemia, unspecified: Secondary | ICD-10-CM | POA: Diagnosis not present

## 2020-03-14 DIAGNOSIS — I10 Essential (primary) hypertension: Secondary | ICD-10-CM | POA: Diagnosis not present

## 2020-03-20 DIAGNOSIS — J449 Chronic obstructive pulmonary disease, unspecified: Secondary | ICD-10-CM | POA: Diagnosis not present

## 2020-03-20 DIAGNOSIS — I10 Essential (primary) hypertension: Secondary | ICD-10-CM | POA: Diagnosis not present

## 2020-03-20 DIAGNOSIS — E7800 Pure hypercholesterolemia, unspecified: Secondary | ICD-10-CM | POA: Diagnosis not present

## 2020-03-25 DIAGNOSIS — I471 Supraventricular tachycardia: Secondary | ICD-10-CM | POA: Diagnosis not present

## 2020-04-05 DIAGNOSIS — I10 Essential (primary) hypertension: Secondary | ICD-10-CM | POA: Diagnosis not present

## 2020-04-15 DIAGNOSIS — I739 Peripheral vascular disease, unspecified: Secondary | ICD-10-CM | POA: Diagnosis not present

## 2020-04-15 DIAGNOSIS — I10 Essential (primary) hypertension: Secondary | ICD-10-CM | POA: Diagnosis not present

## 2020-04-15 DIAGNOSIS — I471 Supraventricular tachycardia: Secondary | ICD-10-CM | POA: Diagnosis not present

## 2020-04-15 DIAGNOSIS — N183 Chronic kidney disease, stage 3 unspecified: Secondary | ICD-10-CM | POA: Diagnosis not present

## 2020-04-15 DIAGNOSIS — J449 Chronic obstructive pulmonary disease, unspecified: Secondary | ICD-10-CM | POA: Diagnosis not present

## 2020-04-15 DIAGNOSIS — F17210 Nicotine dependence, cigarettes, uncomplicated: Secondary | ICD-10-CM | POA: Diagnosis not present

## 2020-04-15 DIAGNOSIS — Z299 Encounter for prophylactic measures, unspecified: Secondary | ICD-10-CM | POA: Diagnosis not present

## 2020-05-05 DIAGNOSIS — I10 Essential (primary) hypertension: Secondary | ICD-10-CM | POA: Diagnosis not present

## 2020-05-05 DIAGNOSIS — E7800 Pure hypercholesterolemia, unspecified: Secondary | ICD-10-CM | POA: Diagnosis not present

## 2020-05-05 DIAGNOSIS — J449 Chronic obstructive pulmonary disease, unspecified: Secondary | ICD-10-CM | POA: Diagnosis not present

## 2020-05-30 DIAGNOSIS — I10 Essential (primary) hypertension: Secondary | ICD-10-CM | POA: Diagnosis not present

## 2020-05-30 DIAGNOSIS — J449 Chronic obstructive pulmonary disease, unspecified: Secondary | ICD-10-CM | POA: Diagnosis not present

## 2020-05-30 DIAGNOSIS — N183 Chronic kidney disease, stage 3 unspecified: Secondary | ICD-10-CM | POA: Diagnosis not present

## 2020-05-30 DIAGNOSIS — I471 Supraventricular tachycardia: Secondary | ICD-10-CM | POA: Diagnosis not present

## 2020-05-30 DIAGNOSIS — Z299 Encounter for prophylactic measures, unspecified: Secondary | ICD-10-CM | POA: Diagnosis not present

## 2020-06-05 DIAGNOSIS — E7800 Pure hypercholesterolemia, unspecified: Secondary | ICD-10-CM | POA: Diagnosis not present

## 2020-06-05 DIAGNOSIS — I10 Essential (primary) hypertension: Secondary | ICD-10-CM | POA: Diagnosis not present

## 2020-06-05 DIAGNOSIS — J449 Chronic obstructive pulmonary disease, unspecified: Secondary | ICD-10-CM | POA: Diagnosis not present

## 2020-07-05 DIAGNOSIS — E7800 Pure hypercholesterolemia, unspecified: Secondary | ICD-10-CM | POA: Diagnosis not present

## 2020-07-05 DIAGNOSIS — J449 Chronic obstructive pulmonary disease, unspecified: Secondary | ICD-10-CM | POA: Diagnosis not present

## 2020-07-05 DIAGNOSIS — I10 Essential (primary) hypertension: Secondary | ICD-10-CM | POA: Diagnosis not present

## 2020-07-08 DIAGNOSIS — I10 Essential (primary) hypertension: Secondary | ICD-10-CM | POA: Diagnosis not present

## 2020-07-08 DIAGNOSIS — J449 Chronic obstructive pulmonary disease, unspecified: Secondary | ICD-10-CM | POA: Diagnosis not present

## 2020-07-08 DIAGNOSIS — E7800 Pure hypercholesterolemia, unspecified: Secondary | ICD-10-CM | POA: Diagnosis not present

## 2020-07-25 DIAGNOSIS — E559 Vitamin D deficiency, unspecified: Secondary | ICD-10-CM | POA: Diagnosis not present

## 2020-07-25 DIAGNOSIS — E7800 Pure hypercholesterolemia, unspecified: Secondary | ICD-10-CM | POA: Diagnosis not present

## 2020-07-25 DIAGNOSIS — Z7189 Other specified counseling: Secondary | ICD-10-CM | POA: Diagnosis not present

## 2020-07-25 DIAGNOSIS — Z299 Encounter for prophylactic measures, unspecified: Secondary | ICD-10-CM | POA: Diagnosis not present

## 2020-07-25 DIAGNOSIS — Z79899 Other long term (current) drug therapy: Secondary | ICD-10-CM | POA: Diagnosis not present

## 2020-07-25 DIAGNOSIS — Z0000 Encounter for general adult medical examination without abnormal findings: Secondary | ICD-10-CM | POA: Diagnosis not present

## 2020-07-25 DIAGNOSIS — R5383 Other fatigue: Secondary | ICD-10-CM | POA: Diagnosis not present

## 2020-07-25 DIAGNOSIS — I10 Essential (primary) hypertension: Secondary | ICD-10-CM | POA: Diagnosis not present

## 2020-08-06 DIAGNOSIS — I10 Essential (primary) hypertension: Secondary | ICD-10-CM | POA: Diagnosis not present

## 2020-09-03 DIAGNOSIS — Z299 Encounter for prophylactic measures, unspecified: Secondary | ICD-10-CM | POA: Diagnosis not present

## 2020-09-03 DIAGNOSIS — I10 Essential (primary) hypertension: Secondary | ICD-10-CM | POA: Diagnosis not present

## 2020-09-03 DIAGNOSIS — N183 Chronic kidney disease, stage 3 unspecified: Secondary | ICD-10-CM | POA: Diagnosis not present

## 2020-09-03 DIAGNOSIS — J449 Chronic obstructive pulmonary disease, unspecified: Secondary | ICD-10-CM | POA: Diagnosis not present

## 2020-09-03 DIAGNOSIS — J329 Chronic sinusitis, unspecified: Secondary | ICD-10-CM | POA: Diagnosis not present

## 2020-09-05 DIAGNOSIS — I10 Essential (primary) hypertension: Secondary | ICD-10-CM | POA: Diagnosis not present

## 2020-09-11 DIAGNOSIS — Z299 Encounter for prophylactic measures, unspecified: Secondary | ICD-10-CM | POA: Diagnosis not present

## 2020-09-11 DIAGNOSIS — N183 Chronic kidney disease, stage 3 unspecified: Secondary | ICD-10-CM | POA: Diagnosis not present

## 2020-09-11 DIAGNOSIS — I739 Peripheral vascular disease, unspecified: Secondary | ICD-10-CM | POA: Diagnosis not present

## 2020-09-11 DIAGNOSIS — I10 Essential (primary) hypertension: Secondary | ICD-10-CM | POA: Diagnosis not present

## 2020-09-11 DIAGNOSIS — F17210 Nicotine dependence, cigarettes, uncomplicated: Secondary | ICD-10-CM | POA: Diagnosis not present

## 2020-10-04 DIAGNOSIS — I10 Essential (primary) hypertension: Secondary | ICD-10-CM | POA: Diagnosis not present

## 2020-10-04 DIAGNOSIS — E7800 Pure hypercholesterolemia, unspecified: Secondary | ICD-10-CM | POA: Diagnosis not present

## 2020-10-04 DIAGNOSIS — J449 Chronic obstructive pulmonary disease, unspecified: Secondary | ICD-10-CM | POA: Diagnosis not present

## 2020-10-05 DIAGNOSIS — I10 Essential (primary) hypertension: Secondary | ICD-10-CM | POA: Diagnosis not present

## 2020-11-01 DIAGNOSIS — I10 Essential (primary) hypertension: Secondary | ICD-10-CM | POA: Diagnosis not present

## 2020-11-01 DIAGNOSIS — R0602 Shortness of breath: Secondary | ICD-10-CM | POA: Diagnosis not present

## 2020-11-01 DIAGNOSIS — R079 Chest pain, unspecified: Secondary | ICD-10-CM | POA: Diagnosis not present

## 2020-11-01 DIAGNOSIS — Z299 Encounter for prophylactic measures, unspecified: Secondary | ICD-10-CM | POA: Diagnosis not present

## 2020-11-01 DIAGNOSIS — R059 Cough, unspecified: Secondary | ICD-10-CM | POA: Diagnosis not present

## 2020-11-01 DIAGNOSIS — J329 Chronic sinusitis, unspecified: Secondary | ICD-10-CM | POA: Diagnosis not present

## 2020-11-05 DIAGNOSIS — E7800 Pure hypercholesterolemia, unspecified: Secondary | ICD-10-CM | POA: Diagnosis not present

## 2020-11-05 DIAGNOSIS — J449 Chronic obstructive pulmonary disease, unspecified: Secondary | ICD-10-CM | POA: Diagnosis not present

## 2020-11-05 DIAGNOSIS — I10 Essential (primary) hypertension: Secondary | ICD-10-CM | POA: Diagnosis not present

## 2020-11-21 DIAGNOSIS — Z23 Encounter for immunization: Secondary | ICD-10-CM | POA: Diagnosis not present

## 2020-12-05 DIAGNOSIS — I10 Essential (primary) hypertension: Secondary | ICD-10-CM | POA: Diagnosis not present

## 2020-12-05 DIAGNOSIS — E7800 Pure hypercholesterolemia, unspecified: Secondary | ICD-10-CM | POA: Diagnosis not present

## 2020-12-05 DIAGNOSIS — J449 Chronic obstructive pulmonary disease, unspecified: Secondary | ICD-10-CM | POA: Diagnosis not present

## 2020-12-17 DIAGNOSIS — Z299 Encounter for prophylactic measures, unspecified: Secondary | ICD-10-CM | POA: Diagnosis not present

## 2020-12-17 DIAGNOSIS — J449 Chronic obstructive pulmonary disease, unspecified: Secondary | ICD-10-CM | POA: Diagnosis not present

## 2020-12-17 DIAGNOSIS — N183 Chronic kidney disease, stage 3 unspecified: Secondary | ICD-10-CM | POA: Diagnosis not present

## 2020-12-17 DIAGNOSIS — F17210 Nicotine dependence, cigarettes, uncomplicated: Secondary | ICD-10-CM | POA: Diagnosis not present

## 2020-12-17 DIAGNOSIS — I739 Peripheral vascular disease, unspecified: Secondary | ICD-10-CM | POA: Diagnosis not present

## 2020-12-17 DIAGNOSIS — I10 Essential (primary) hypertension: Secondary | ICD-10-CM | POA: Diagnosis not present

## 2021-01-06 DIAGNOSIS — I10 Essential (primary) hypertension: Secondary | ICD-10-CM | POA: Diagnosis not present

## 2021-02-03 DIAGNOSIS — I10 Essential (primary) hypertension: Secondary | ICD-10-CM | POA: Diagnosis not present

## 2021-03-04 DIAGNOSIS — J329 Chronic sinusitis, unspecified: Secondary | ICD-10-CM | POA: Diagnosis not present

## 2021-03-04 DIAGNOSIS — Z87891 Personal history of nicotine dependence: Secondary | ICD-10-CM | POA: Diagnosis not present

## 2021-03-04 DIAGNOSIS — Z299 Encounter for prophylactic measures, unspecified: Secondary | ICD-10-CM | POA: Diagnosis not present

## 2021-03-04 DIAGNOSIS — I10 Essential (primary) hypertension: Secondary | ICD-10-CM | POA: Diagnosis not present

## 2021-03-04 DIAGNOSIS — H9190 Unspecified hearing loss, unspecified ear: Secondary | ICD-10-CM | POA: Diagnosis not present

## 2021-03-04 DIAGNOSIS — J449 Chronic obstructive pulmonary disease, unspecified: Secondary | ICD-10-CM | POA: Diagnosis not present

## 2021-03-06 DIAGNOSIS — I10 Essential (primary) hypertension: Secondary | ICD-10-CM | POA: Diagnosis not present

## 2021-03-12 DIAGNOSIS — I739 Peripheral vascular disease, unspecified: Secondary | ICD-10-CM | POA: Diagnosis not present

## 2021-03-12 DIAGNOSIS — J019 Acute sinusitis, unspecified: Secondary | ICD-10-CM | POA: Diagnosis not present

## 2021-03-12 DIAGNOSIS — Z299 Encounter for prophylactic measures, unspecified: Secondary | ICD-10-CM | POA: Diagnosis not present

## 2021-03-12 DIAGNOSIS — I10 Essential (primary) hypertension: Secondary | ICD-10-CM | POA: Diagnosis not present

## 2021-03-12 DIAGNOSIS — J449 Chronic obstructive pulmonary disease, unspecified: Secondary | ICD-10-CM | POA: Diagnosis not present

## 2021-03-13 DIAGNOSIS — I493 Ventricular premature depolarization: Secondary | ICD-10-CM | POA: Diagnosis not present

## 2021-03-13 DIAGNOSIS — I619 Nontraumatic intracerebral hemorrhage, unspecified: Secondary | ICD-10-CM | POA: Diagnosis not present

## 2021-03-13 DIAGNOSIS — R519 Headache, unspecified: Secondary | ICD-10-CM | POA: Diagnosis not present

## 2021-03-13 DIAGNOSIS — D649 Anemia, unspecified: Secondary | ICD-10-CM | POA: Diagnosis not present

## 2021-03-13 DIAGNOSIS — I10 Essential (primary) hypertension: Secondary | ICD-10-CM | POA: Diagnosis not present

## 2021-03-13 DIAGNOSIS — I160 Hypertensive urgency: Secondary | ICD-10-CM | POA: Diagnosis not present

## 2021-03-13 DIAGNOSIS — I6502 Occlusion and stenosis of left vertebral artery: Secondary | ICD-10-CM | POA: Diagnosis not present

## 2021-03-13 DIAGNOSIS — I517 Cardiomegaly: Secondary | ICD-10-CM | POA: Diagnosis not present

## 2021-03-13 DIAGNOSIS — R9082 White matter disease, unspecified: Secondary | ICD-10-CM | POA: Diagnosis not present

## 2021-03-13 DIAGNOSIS — I129 Hypertensive chronic kidney disease with stage 1 through stage 4 chronic kidney disease, or unspecified chronic kidney disease: Secondary | ICD-10-CM | POA: Diagnosis not present

## 2021-03-13 DIAGNOSIS — D72829 Elevated white blood cell count, unspecified: Secondary | ICD-10-CM | POA: Diagnosis not present

## 2021-03-13 DIAGNOSIS — G9389 Other specified disorders of brain: Secondary | ICD-10-CM | POA: Diagnosis not present

## 2021-03-13 DIAGNOSIS — I313 Pericardial effusion (noninflammatory): Secondary | ICD-10-CM | POA: Diagnosis not present

## 2021-03-13 DIAGNOSIS — I615 Nontraumatic intracerebral hemorrhage, intraventricular: Secondary | ICD-10-CM | POA: Diagnosis not present

## 2021-03-13 DIAGNOSIS — E871 Hypo-osmolality and hyponatremia: Secondary | ICD-10-CM | POA: Diagnosis not present

## 2021-03-13 DIAGNOSIS — I629 Nontraumatic intracranial hemorrhage, unspecified: Secondary | ICD-10-CM | POA: Diagnosis not present

## 2021-03-13 DIAGNOSIS — I083 Combined rheumatic disorders of mitral, aortic and tricuspid valves: Secondary | ICD-10-CM | POA: Diagnosis not present

## 2021-03-13 DIAGNOSIS — N3090 Cystitis, unspecified without hematuria: Secondary | ICD-10-CM | POA: Diagnosis not present

## 2021-03-13 DIAGNOSIS — G919 Hydrocephalus, unspecified: Secondary | ICD-10-CM | POA: Diagnosis not present

## 2021-03-13 DIAGNOSIS — N1831 Chronic kidney disease, stage 3a: Secondary | ICD-10-CM | POA: Diagnosis not present

## 2021-03-13 DIAGNOSIS — E876 Hypokalemia: Secondary | ICD-10-CM | POA: Diagnosis not present

## 2021-03-14 DIAGNOSIS — R9082 White matter disease, unspecified: Secondary | ICD-10-CM | POA: Diagnosis not present

## 2021-03-14 DIAGNOSIS — I10 Essential (primary) hypertension: Secondary | ICD-10-CM | POA: Diagnosis not present

## 2021-03-14 DIAGNOSIS — E871 Hypo-osmolality and hyponatremia: Secondary | ICD-10-CM | POA: Diagnosis not present

## 2021-03-14 DIAGNOSIS — I083 Combined rheumatic disorders of mitral, aortic and tricuspid valves: Secondary | ICD-10-CM | POA: Diagnosis not present

## 2021-03-14 DIAGNOSIS — G919 Hydrocephalus, unspecified: Secondary | ICD-10-CM | POA: Diagnosis not present

## 2021-03-14 DIAGNOSIS — G9389 Other specified disorders of brain: Secondary | ICD-10-CM | POA: Diagnosis not present

## 2021-03-14 DIAGNOSIS — I615 Nontraumatic intracerebral hemorrhage, intraventricular: Secondary | ICD-10-CM | POA: Diagnosis not present

## 2021-03-14 DIAGNOSIS — I313 Pericardial effusion (noninflammatory): Secondary | ICD-10-CM | POA: Diagnosis not present

## 2021-03-14 DIAGNOSIS — I517 Cardiomegaly: Secondary | ICD-10-CM | POA: Diagnosis not present

## 2021-03-15 DIAGNOSIS — D72829 Elevated white blood cell count, unspecified: Secondary | ICD-10-CM | POA: Diagnosis not present

## 2021-03-15 DIAGNOSIS — N1831 Chronic kidney disease, stage 3a: Secondary | ICD-10-CM | POA: Diagnosis not present

## 2021-03-15 DIAGNOSIS — D649 Anemia, unspecified: Secondary | ICD-10-CM | POA: Diagnosis not present

## 2021-03-15 DIAGNOSIS — R519 Headache, unspecified: Secondary | ICD-10-CM | POA: Diagnosis not present

## 2021-03-15 DIAGNOSIS — I615 Nontraumatic intracerebral hemorrhage, intraventricular: Secondary | ICD-10-CM | POA: Diagnosis not present

## 2021-03-16 DIAGNOSIS — I160 Hypertensive urgency: Secondary | ICD-10-CM | POA: Diagnosis not present

## 2021-03-16 DIAGNOSIS — N1831 Chronic kidney disease, stage 3a: Secondary | ICD-10-CM | POA: Diagnosis not present

## 2021-03-16 DIAGNOSIS — I615 Nontraumatic intracerebral hemorrhage, intraventricular: Secondary | ICD-10-CM | POA: Diagnosis not present

## 2021-03-17 DIAGNOSIS — I160 Hypertensive urgency: Secondary | ICD-10-CM | POA: Diagnosis not present

## 2021-03-17 DIAGNOSIS — N1831 Chronic kidney disease, stage 3a: Secondary | ICD-10-CM | POA: Diagnosis not present

## 2021-03-17 DIAGNOSIS — I615 Nontraumatic intracerebral hemorrhage, intraventricular: Secondary | ICD-10-CM | POA: Diagnosis not present

## 2021-03-18 DIAGNOSIS — I619 Nontraumatic intracerebral hemorrhage, unspecified: Secondary | ICD-10-CM | POA: Diagnosis not present

## 2021-03-19 DIAGNOSIS — I615 Nontraumatic intracerebral hemorrhage, intraventricular: Secondary | ICD-10-CM | POA: Diagnosis not present

## 2021-03-19 DIAGNOSIS — I619 Nontraumatic intracerebral hemorrhage, unspecified: Secondary | ICD-10-CM | POA: Diagnosis not present

## 2021-03-24 DIAGNOSIS — N3000 Acute cystitis without hematuria: Secondary | ICD-10-CM | POA: Diagnosis not present

## 2021-03-24 DIAGNOSIS — I129 Hypertensive chronic kidney disease with stage 1 through stage 4 chronic kidney disease, or unspecified chronic kidney disease: Secondary | ICD-10-CM | POA: Diagnosis not present

## 2021-03-24 DIAGNOSIS — N1831 Chronic kidney disease, stage 3a: Secondary | ICD-10-CM | POA: Diagnosis not present

## 2021-03-24 DIAGNOSIS — I69198 Other sequelae of nontraumatic intracerebral hemorrhage: Secondary | ICD-10-CM | POA: Diagnosis not present

## 2021-03-24 DIAGNOSIS — Z9181 History of falling: Secondary | ICD-10-CM | POA: Diagnosis not present

## 2021-03-24 DIAGNOSIS — G919 Hydrocephalus, unspecified: Secondary | ICD-10-CM | POA: Diagnosis not present

## 2021-03-24 DIAGNOSIS — R519 Headache, unspecified: Secondary | ICD-10-CM | POA: Diagnosis not present

## 2021-03-26 DIAGNOSIS — N3000 Acute cystitis without hematuria: Secondary | ICD-10-CM | POA: Diagnosis not present

## 2021-03-26 DIAGNOSIS — N1831 Chronic kidney disease, stage 3a: Secondary | ICD-10-CM | POA: Diagnosis not present

## 2021-03-26 DIAGNOSIS — Z9181 History of falling: Secondary | ICD-10-CM | POA: Diagnosis not present

## 2021-03-26 DIAGNOSIS — R519 Headache, unspecified: Secondary | ICD-10-CM | POA: Diagnosis not present

## 2021-03-26 DIAGNOSIS — I69198 Other sequelae of nontraumatic intracerebral hemorrhage: Secondary | ICD-10-CM | POA: Diagnosis not present

## 2021-03-26 DIAGNOSIS — G919 Hydrocephalus, unspecified: Secondary | ICD-10-CM | POA: Diagnosis not present

## 2021-03-26 DIAGNOSIS — I129 Hypertensive chronic kidney disease with stage 1 through stage 4 chronic kidney disease, or unspecified chronic kidney disease: Secondary | ICD-10-CM | POA: Diagnosis not present

## 2021-03-27 DIAGNOSIS — G919 Hydrocephalus, unspecified: Secondary | ICD-10-CM | POA: Diagnosis not present

## 2021-03-27 DIAGNOSIS — N1831 Chronic kidney disease, stage 3a: Secondary | ICD-10-CM | POA: Diagnosis not present

## 2021-03-27 DIAGNOSIS — N3000 Acute cystitis without hematuria: Secondary | ICD-10-CM | POA: Diagnosis not present

## 2021-03-27 DIAGNOSIS — I129 Hypertensive chronic kidney disease with stage 1 through stage 4 chronic kidney disease, or unspecified chronic kidney disease: Secondary | ICD-10-CM | POA: Diagnosis not present

## 2021-03-27 DIAGNOSIS — R519 Headache, unspecified: Secondary | ICD-10-CM | POA: Diagnosis not present

## 2021-03-27 DIAGNOSIS — Z9181 History of falling: Secondary | ICD-10-CM | POA: Diagnosis not present

## 2021-03-27 DIAGNOSIS — I69198 Other sequelae of nontraumatic intracerebral hemorrhage: Secondary | ICD-10-CM | POA: Diagnosis not present

## 2021-03-28 DIAGNOSIS — I10 Essential (primary) hypertension: Secondary | ICD-10-CM | POA: Diagnosis not present

## 2021-03-28 DIAGNOSIS — Z299 Encounter for prophylactic measures, unspecified: Secondary | ICD-10-CM | POA: Diagnosis not present

## 2021-03-28 DIAGNOSIS — E7800 Pure hypercholesterolemia, unspecified: Secondary | ICD-10-CM | POA: Diagnosis not present

## 2021-03-28 DIAGNOSIS — I615 Nontraumatic intracerebral hemorrhage, intraventricular: Secondary | ICD-10-CM | POA: Diagnosis not present

## 2021-03-28 DIAGNOSIS — Z09 Encounter for follow-up examination after completed treatment for conditions other than malignant neoplasm: Secondary | ICD-10-CM | POA: Diagnosis not present

## 2021-03-31 DIAGNOSIS — G919 Hydrocephalus, unspecified: Secondary | ICD-10-CM | POA: Diagnosis not present

## 2021-03-31 DIAGNOSIS — N1831 Chronic kidney disease, stage 3a: Secondary | ICD-10-CM | POA: Diagnosis not present

## 2021-03-31 DIAGNOSIS — N3000 Acute cystitis without hematuria: Secondary | ICD-10-CM | POA: Diagnosis not present

## 2021-03-31 DIAGNOSIS — Z9181 History of falling: Secondary | ICD-10-CM | POA: Diagnosis not present

## 2021-03-31 DIAGNOSIS — I69198 Other sequelae of nontraumatic intracerebral hemorrhage: Secondary | ICD-10-CM | POA: Diagnosis not present

## 2021-03-31 DIAGNOSIS — R519 Headache, unspecified: Secondary | ICD-10-CM | POA: Diagnosis not present

## 2021-03-31 DIAGNOSIS — I129 Hypertensive chronic kidney disease with stage 1 through stage 4 chronic kidney disease, or unspecified chronic kidney disease: Secondary | ICD-10-CM | POA: Diagnosis not present

## 2021-04-01 DIAGNOSIS — I615 Nontraumatic intracerebral hemorrhage, intraventricular: Secondary | ICD-10-CM | POA: Diagnosis not present

## 2021-04-01 DIAGNOSIS — I619 Nontraumatic intracerebral hemorrhage, unspecified: Secondary | ICD-10-CM | POA: Diagnosis not present

## 2021-04-03 DIAGNOSIS — I129 Hypertensive chronic kidney disease with stage 1 through stage 4 chronic kidney disease, or unspecified chronic kidney disease: Secondary | ICD-10-CM | POA: Diagnosis not present

## 2021-04-03 DIAGNOSIS — N1831 Chronic kidney disease, stage 3a: Secondary | ICD-10-CM | POA: Diagnosis not present

## 2021-04-03 DIAGNOSIS — I69198 Other sequelae of nontraumatic intracerebral hemorrhage: Secondary | ICD-10-CM | POA: Diagnosis not present

## 2021-04-03 DIAGNOSIS — Z9181 History of falling: Secondary | ICD-10-CM | POA: Diagnosis not present

## 2021-04-03 DIAGNOSIS — R519 Headache, unspecified: Secondary | ICD-10-CM | POA: Diagnosis not present

## 2021-04-03 DIAGNOSIS — G919 Hydrocephalus, unspecified: Secondary | ICD-10-CM | POA: Diagnosis not present

## 2021-04-03 DIAGNOSIS — N3000 Acute cystitis without hematuria: Secondary | ICD-10-CM | POA: Diagnosis not present

## 2021-04-05 DIAGNOSIS — N183 Chronic kidney disease, stage 3 unspecified: Secondary | ICD-10-CM | POA: Diagnosis not present

## 2021-04-05 DIAGNOSIS — N181 Chronic kidney disease, stage 1: Secondary | ICD-10-CM | POA: Diagnosis not present

## 2021-04-05 DIAGNOSIS — E785 Hyperlipidemia, unspecified: Secondary | ICD-10-CM | POA: Diagnosis not present

## 2021-04-05 DIAGNOSIS — I10 Essential (primary) hypertension: Secondary | ICD-10-CM | POA: Diagnosis not present

## 2021-04-07 DIAGNOSIS — R519 Headache, unspecified: Secondary | ICD-10-CM | POA: Diagnosis not present

## 2021-04-07 DIAGNOSIS — N1831 Chronic kidney disease, stage 3a: Secondary | ICD-10-CM | POA: Diagnosis not present

## 2021-04-07 DIAGNOSIS — N3000 Acute cystitis without hematuria: Secondary | ICD-10-CM | POA: Diagnosis not present

## 2021-04-07 DIAGNOSIS — I129 Hypertensive chronic kidney disease with stage 1 through stage 4 chronic kidney disease, or unspecified chronic kidney disease: Secondary | ICD-10-CM | POA: Diagnosis not present

## 2021-04-07 DIAGNOSIS — G919 Hydrocephalus, unspecified: Secondary | ICD-10-CM | POA: Diagnosis not present

## 2021-04-07 DIAGNOSIS — Z9181 History of falling: Secondary | ICD-10-CM | POA: Diagnosis not present

## 2021-04-07 DIAGNOSIS — I69198 Other sequelae of nontraumatic intracerebral hemorrhage: Secondary | ICD-10-CM | POA: Diagnosis not present

## 2021-04-09 DIAGNOSIS — N1831 Chronic kidney disease, stage 3a: Secondary | ICD-10-CM | POA: Diagnosis not present

## 2021-04-09 DIAGNOSIS — I129 Hypertensive chronic kidney disease with stage 1 through stage 4 chronic kidney disease, or unspecified chronic kidney disease: Secondary | ICD-10-CM | POA: Diagnosis not present

## 2021-04-09 DIAGNOSIS — G919 Hydrocephalus, unspecified: Secondary | ICD-10-CM | POA: Diagnosis not present

## 2021-04-09 DIAGNOSIS — N3000 Acute cystitis without hematuria: Secondary | ICD-10-CM | POA: Diagnosis not present

## 2021-04-09 DIAGNOSIS — R519 Headache, unspecified: Secondary | ICD-10-CM | POA: Diagnosis not present

## 2021-04-09 DIAGNOSIS — Z9181 History of falling: Secondary | ICD-10-CM | POA: Diagnosis not present

## 2021-04-09 DIAGNOSIS — I69198 Other sequelae of nontraumatic intracerebral hemorrhage: Secondary | ICD-10-CM | POA: Diagnosis not present

## 2021-04-10 DIAGNOSIS — Z9181 History of falling: Secondary | ICD-10-CM | POA: Diagnosis not present

## 2021-04-10 DIAGNOSIS — I129 Hypertensive chronic kidney disease with stage 1 through stage 4 chronic kidney disease, or unspecified chronic kidney disease: Secondary | ICD-10-CM | POA: Diagnosis not present

## 2021-04-10 DIAGNOSIS — I69198 Other sequelae of nontraumatic intracerebral hemorrhage: Secondary | ICD-10-CM | POA: Diagnosis not present

## 2021-04-10 DIAGNOSIS — R519 Headache, unspecified: Secondary | ICD-10-CM | POA: Diagnosis not present

## 2021-04-10 DIAGNOSIS — N1831 Chronic kidney disease, stage 3a: Secondary | ICD-10-CM | POA: Diagnosis not present

## 2021-04-10 DIAGNOSIS — G919 Hydrocephalus, unspecified: Secondary | ICD-10-CM | POA: Diagnosis not present

## 2021-04-10 DIAGNOSIS — N3000 Acute cystitis without hematuria: Secondary | ICD-10-CM | POA: Diagnosis not present

## 2021-04-11 DIAGNOSIS — I129 Hypertensive chronic kidney disease with stage 1 through stage 4 chronic kidney disease, or unspecified chronic kidney disease: Secondary | ICD-10-CM | POA: Diagnosis not present

## 2021-04-11 DIAGNOSIS — N1831 Chronic kidney disease, stage 3a: Secondary | ICD-10-CM | POA: Diagnosis not present

## 2021-04-11 DIAGNOSIS — Z9181 History of falling: Secondary | ICD-10-CM | POA: Diagnosis not present

## 2021-04-11 DIAGNOSIS — I69198 Other sequelae of nontraumatic intracerebral hemorrhage: Secondary | ICD-10-CM | POA: Diagnosis not present

## 2021-04-11 DIAGNOSIS — R519 Headache, unspecified: Secondary | ICD-10-CM | POA: Diagnosis not present

## 2021-04-11 DIAGNOSIS — G919 Hydrocephalus, unspecified: Secondary | ICD-10-CM | POA: Diagnosis not present

## 2021-04-11 DIAGNOSIS — N3000 Acute cystitis without hematuria: Secondary | ICD-10-CM | POA: Diagnosis not present

## 2021-04-15 DIAGNOSIS — G919 Hydrocephalus, unspecified: Secondary | ICD-10-CM | POA: Diagnosis not present

## 2021-04-15 DIAGNOSIS — Z9181 History of falling: Secondary | ICD-10-CM | POA: Diagnosis not present

## 2021-04-15 DIAGNOSIS — R519 Headache, unspecified: Secondary | ICD-10-CM | POA: Diagnosis not present

## 2021-04-15 DIAGNOSIS — I129 Hypertensive chronic kidney disease with stage 1 through stage 4 chronic kidney disease, or unspecified chronic kidney disease: Secondary | ICD-10-CM | POA: Diagnosis not present

## 2021-04-15 DIAGNOSIS — I69198 Other sequelae of nontraumatic intracerebral hemorrhage: Secondary | ICD-10-CM | POA: Diagnosis not present

## 2021-04-15 DIAGNOSIS — N1831 Chronic kidney disease, stage 3a: Secondary | ICD-10-CM | POA: Diagnosis not present

## 2021-04-15 DIAGNOSIS — N3000 Acute cystitis without hematuria: Secondary | ICD-10-CM | POA: Diagnosis not present

## 2021-04-16 DIAGNOSIS — Z9181 History of falling: Secondary | ICD-10-CM | POA: Diagnosis not present

## 2021-04-16 DIAGNOSIS — N3000 Acute cystitis without hematuria: Secondary | ICD-10-CM | POA: Diagnosis not present

## 2021-04-16 DIAGNOSIS — I69198 Other sequelae of nontraumatic intracerebral hemorrhage: Secondary | ICD-10-CM | POA: Diagnosis not present

## 2021-04-16 DIAGNOSIS — I129 Hypertensive chronic kidney disease with stage 1 through stage 4 chronic kidney disease, or unspecified chronic kidney disease: Secondary | ICD-10-CM | POA: Diagnosis not present

## 2021-04-16 DIAGNOSIS — R519 Headache, unspecified: Secondary | ICD-10-CM | POA: Diagnosis not present

## 2021-04-16 DIAGNOSIS — G919 Hydrocephalus, unspecified: Secondary | ICD-10-CM | POA: Diagnosis not present

## 2021-04-16 DIAGNOSIS — N1831 Chronic kidney disease, stage 3a: Secondary | ICD-10-CM | POA: Diagnosis not present

## 2021-05-06 DIAGNOSIS — I10 Essential (primary) hypertension: Secondary | ICD-10-CM | POA: Diagnosis not present

## 2021-05-07 DIAGNOSIS — Z299 Encounter for prophylactic measures, unspecified: Secondary | ICD-10-CM | POA: Diagnosis not present

## 2021-05-07 DIAGNOSIS — I10 Essential (primary) hypertension: Secondary | ICD-10-CM | POA: Diagnosis not present

## 2021-05-07 DIAGNOSIS — N183 Chronic kidney disease, stage 3 unspecified: Secondary | ICD-10-CM | POA: Diagnosis not present

## 2021-06-05 DIAGNOSIS — I10 Essential (primary) hypertension: Secondary | ICD-10-CM | POA: Diagnosis not present

## 2021-06-05 DIAGNOSIS — N183 Chronic kidney disease, stage 3 unspecified: Secondary | ICD-10-CM | POA: Diagnosis not present

## 2021-06-05 DIAGNOSIS — N181 Chronic kidney disease, stage 1: Secondary | ICD-10-CM | POA: Diagnosis not present

## 2021-06-05 DIAGNOSIS — E785 Hyperlipidemia, unspecified: Secondary | ICD-10-CM | POA: Diagnosis not present

## 2021-07-04 DIAGNOSIS — I10 Essential (primary) hypertension: Secondary | ICD-10-CM | POA: Diagnosis not present

## 2021-07-06 DIAGNOSIS — I10 Essential (primary) hypertension: Secondary | ICD-10-CM | POA: Diagnosis not present

## 2021-07-29 DIAGNOSIS — Z299 Encounter for prophylactic measures, unspecified: Secondary | ICD-10-CM | POA: Diagnosis not present

## 2021-07-29 DIAGNOSIS — R609 Edema, unspecified: Secondary | ICD-10-CM | POA: Diagnosis not present

## 2021-07-29 DIAGNOSIS — I739 Peripheral vascular disease, unspecified: Secondary | ICD-10-CM | POA: Diagnosis not present

## 2021-07-29 DIAGNOSIS — I471 Supraventricular tachycardia: Secondary | ICD-10-CM | POA: Diagnosis not present

## 2021-07-29 DIAGNOSIS — N183 Chronic kidney disease, stage 3 unspecified: Secondary | ICD-10-CM | POA: Diagnosis not present

## 2021-08-06 DIAGNOSIS — E559 Vitamin D deficiency, unspecified: Secondary | ICD-10-CM | POA: Diagnosis not present

## 2021-08-06 DIAGNOSIS — Z0000 Encounter for general adult medical examination without abnormal findings: Secondary | ICD-10-CM | POA: Diagnosis not present

## 2021-08-06 DIAGNOSIS — I10 Essential (primary) hypertension: Secondary | ICD-10-CM | POA: Diagnosis not present

## 2021-08-06 DIAGNOSIS — E7800 Pure hypercholesterolemia, unspecified: Secondary | ICD-10-CM | POA: Diagnosis not present

## 2021-08-06 DIAGNOSIS — Z7189 Other specified counseling: Secondary | ICD-10-CM | POA: Diagnosis not present

## 2021-08-06 DIAGNOSIS — R5383 Other fatigue: Secondary | ICD-10-CM | POA: Diagnosis not present

## 2021-08-06 DIAGNOSIS — Z1211 Encounter for screening for malignant neoplasm of colon: Secondary | ICD-10-CM | POA: Diagnosis not present

## 2021-08-06 DIAGNOSIS — Z299 Encounter for prophylactic measures, unspecified: Secondary | ICD-10-CM | POA: Diagnosis not present

## 2021-08-06 DIAGNOSIS — I739 Peripheral vascular disease, unspecified: Secondary | ICD-10-CM | POA: Diagnosis not present

## 2021-08-06 DIAGNOSIS — Z79899 Other long term (current) drug therapy: Secondary | ICD-10-CM | POA: Diagnosis not present

## 2021-08-18 ENCOUNTER — Other Ambulatory Visit: Payer: Self-pay | Admitting: Internal Medicine

## 2021-08-18 DIAGNOSIS — Z139 Encounter for screening, unspecified: Secondary | ICD-10-CM

## 2021-08-19 ENCOUNTER — Other Ambulatory Visit: Payer: Self-pay

## 2021-08-19 ENCOUNTER — Ambulatory Visit
Admission: RE | Admit: 2021-08-19 | Discharge: 2021-08-19 | Disposition: A | Discharge status: Home / Self Care | Payer: Medicare Other | Source: Ambulatory Visit | Attending: Internal Medicine | Admitting: Internal Medicine

## 2021-08-19 DIAGNOSIS — Z1231 Encounter for screening mammogram for malignant neoplasm of breast: Secondary | ICD-10-CM | POA: Diagnosis not present

## 2021-08-19 DIAGNOSIS — Z139 Encounter for screening, unspecified: Secondary | ICD-10-CM

## 2021-09-05 DIAGNOSIS — E7800 Pure hypercholesterolemia, unspecified: Secondary | ICD-10-CM | POA: Diagnosis not present

## 2021-09-05 DIAGNOSIS — I10 Essential (primary) hypertension: Secondary | ICD-10-CM | POA: Diagnosis not present

## 2021-09-08 DIAGNOSIS — Z23 Encounter for immunization: Secondary | ICD-10-CM | POA: Diagnosis not present

## 2021-10-02 DIAGNOSIS — I615 Nontraumatic intracerebral hemorrhage, intraventricular: Secondary | ICD-10-CM | POA: Diagnosis not present

## 2021-10-02 DIAGNOSIS — I160 Hypertensive urgency: Secondary | ICD-10-CM | POA: Diagnosis not present

## 2021-10-02 DIAGNOSIS — I10 Essential (primary) hypertension: Secondary | ICD-10-CM | POA: Diagnosis not present

## 2021-10-02 DIAGNOSIS — I619 Nontraumatic intracerebral hemorrhage, unspecified: Secondary | ICD-10-CM | POA: Diagnosis not present

## 2021-10-06 DIAGNOSIS — I10 Essential (primary) hypertension: Secondary | ICD-10-CM | POA: Diagnosis not present

## 2021-11-05 DIAGNOSIS — I10 Essential (primary) hypertension: Secondary | ICD-10-CM | POA: Diagnosis not present

## 2021-11-18 DIAGNOSIS — Z299 Encounter for prophylactic measures, unspecified: Secondary | ICD-10-CM | POA: Diagnosis not present

## 2021-11-18 DIAGNOSIS — J449 Chronic obstructive pulmonary disease, unspecified: Secondary | ICD-10-CM | POA: Diagnosis not present

## 2021-11-18 DIAGNOSIS — Z87891 Personal history of nicotine dependence: Secondary | ICD-10-CM | POA: Diagnosis not present

## 2021-11-18 DIAGNOSIS — R0602 Shortness of breath: Secondary | ICD-10-CM | POA: Diagnosis not present

## 2021-11-18 DIAGNOSIS — I10 Essential (primary) hypertension: Secondary | ICD-10-CM | POA: Diagnosis not present

## 2021-12-05 DIAGNOSIS — I10 Essential (primary) hypertension: Secondary | ICD-10-CM | POA: Diagnosis not present

## 2021-12-19 DIAGNOSIS — N183 Chronic kidney disease, stage 3 unspecified: Secondary | ICD-10-CM | POA: Diagnosis not present

## 2021-12-19 DIAGNOSIS — R0602 Shortness of breath: Secondary | ICD-10-CM | POA: Diagnosis not present

## 2021-12-19 DIAGNOSIS — Z299 Encounter for prophylactic measures, unspecified: Secondary | ICD-10-CM | POA: Diagnosis not present

## 2021-12-19 DIAGNOSIS — J449 Chronic obstructive pulmonary disease, unspecified: Secondary | ICD-10-CM | POA: Diagnosis not present

## 2021-12-19 DIAGNOSIS — I10 Essential (primary) hypertension: Secondary | ICD-10-CM | POA: Diagnosis not present

## 2021-12-19 DIAGNOSIS — Z87891 Personal history of nicotine dependence: Secondary | ICD-10-CM | POA: Diagnosis not present

## 2022-01-04 DIAGNOSIS — I10 Essential (primary) hypertension: Secondary | ICD-10-CM | POA: Diagnosis not present

## 2022-01-06 DIAGNOSIS — E7800 Pure hypercholesterolemia, unspecified: Secondary | ICD-10-CM | POA: Diagnosis not present

## 2022-01-06 DIAGNOSIS — I10 Essential (primary) hypertension: Secondary | ICD-10-CM | POA: Diagnosis not present

## 2022-01-09 DIAGNOSIS — J449 Chronic obstructive pulmonary disease, unspecified: Secondary | ICD-10-CM | POA: Diagnosis not present

## 2022-01-09 DIAGNOSIS — Z299 Encounter for prophylactic measures, unspecified: Secondary | ICD-10-CM | POA: Diagnosis not present

## 2022-01-09 DIAGNOSIS — I10 Essential (primary) hypertension: Secondary | ICD-10-CM | POA: Diagnosis not present

## 2022-01-09 DIAGNOSIS — I471 Supraventricular tachycardia: Secondary | ICD-10-CM | POA: Diagnosis not present

## 2022-01-09 DIAGNOSIS — Z87891 Personal history of nicotine dependence: Secondary | ICD-10-CM | POA: Diagnosis not present

## 2022-01-09 DIAGNOSIS — R0602 Shortness of breath: Secondary | ICD-10-CM | POA: Diagnosis not present

## 2022-01-28 DIAGNOSIS — I700 Atherosclerosis of aorta: Secondary | ICD-10-CM | POA: Diagnosis not present

## 2022-01-28 DIAGNOSIS — I3139 Other pericardial effusion (noninflammatory): Secondary | ICD-10-CM | POA: Diagnosis not present

## 2022-01-28 DIAGNOSIS — R590 Localized enlarged lymph nodes: Secondary | ICD-10-CM | POA: Diagnosis not present

## 2022-01-28 DIAGNOSIS — R935 Abnormal findings on diagnostic imaging of other abdominal regions, including retroperitoneum: Secondary | ICD-10-CM | POA: Diagnosis not present

## 2022-01-28 DIAGNOSIS — R0602 Shortness of breath: Secondary | ICD-10-CM | POA: Diagnosis not present

## 2022-01-28 DIAGNOSIS — J9811 Atelectasis: Secondary | ICD-10-CM | POA: Diagnosis not present

## 2022-02-13 DIAGNOSIS — I10 Essential (primary) hypertension: Secondary | ICD-10-CM | POA: Diagnosis not present

## 2022-02-13 DIAGNOSIS — M545 Low back pain, unspecified: Secondary | ICD-10-CM | POA: Diagnosis not present

## 2022-02-13 DIAGNOSIS — M549 Dorsalgia, unspecified: Secondary | ICD-10-CM | POA: Diagnosis not present

## 2022-02-13 DIAGNOSIS — Z299 Encounter for prophylactic measures, unspecified: Secondary | ICD-10-CM | POA: Diagnosis not present

## 2022-02-13 DIAGNOSIS — J449 Chronic obstructive pulmonary disease, unspecified: Secondary | ICD-10-CM | POA: Diagnosis not present

## 2022-02-13 DIAGNOSIS — M5136 Other intervertebral disc degeneration, lumbar region: Secondary | ICD-10-CM | POA: Diagnosis not present

## 2022-02-26 ENCOUNTER — Encounter: Payer: Self-pay | Admitting: Internal Medicine

## 2022-02-26 ENCOUNTER — Other Ambulatory Visit: Payer: Self-pay

## 2022-02-26 ENCOUNTER — Ambulatory Visit: Payer: Medicare Other | Admitting: Internal Medicine

## 2022-02-26 DIAGNOSIS — R0600 Dyspnea, unspecified: Secondary | ICD-10-CM | POA: Insufficient documentation

## 2022-02-26 DIAGNOSIS — R0609 Other forms of dyspnea: Secondary | ICD-10-CM

## 2022-02-26 NOTE — Patient Instructions (Addendum)
Please remember to go to the lab department   for your tests - we will call you with the results when they are available. ?    ? ?I will schedule an echo at [facility]  and call you with the results  ? ?I will schedule PFTs next available - Ginette Otto is fine  ? ?To get the most out of exercise, you need to be continuously aware that you are short of breath, but never out of breath, for at least 30 minutes daily. As you improve, it will actually be easier for you to do the same amount of exercise  in  30 minutes so always push to the level where you are short of breath  ? ? ?Make sure you check your oxygen saturations at highest level of activity  ? ?Pulmonary follow up will be as needed  ? ? ? ? ?

## 2022-02-26 NOTE — Progress Notes (Signed)
? ?Nicole Nash, female    DOB: 1946/04/22,    MRN: [id number] ? ? ?Brief patient profile:  ? ?75   yowf  quit smoking 08/2011  referred to pulmonary clinic in [place]  02/26/2022 by Dr Kirstie Peri for doe x fall 2022.  ? ?Baseline used to walk neighborhood at wt 130-140 lb ? ?Brain bleed March 2022 mainly affected balance and reduced activity / wt gain since ? ?History of Present Illness  ?02/26/2022  Pulmonary/ 1st office eval/ Nicole Nash / [place] [facility] @  173 lbs ?Chief Complaint  ?Patient presents with  ? Consult  ?  Patient here for SOB starting less than 6 months ago.   ?Dyspnea:  hc parking/ leaning on cart but getting harder to do still do a [employer] leaning on cart but back also a problem ?Cough: none  ?Sleep: bed is flat one pillow and struggles to breathe x 5 min/ 1am - 4 am comes back  ?SABA use: not on it/ no better on Trelegy  ? ?No obvious day to day or daytime variability or assoc excess/ purulent sputum or mucus plugs or hemoptysis or cp or chest tightness, subjective wheeze or overt sinus or hb symptoms.  ? ?  Also denies any obvious fluctuation of symptoms with weather or environmental changes or other aggravating or alleviating factors except as outlined above  ? ?No unusual exposure hx or h/o childhood pna/ asthma or knowledge of premature birth. ? ?Current Allergies, Complete Past Medical History, Past Surgical History, Family History, and Social History were reviewed in [employer] record. ? ?ROS  The following are not active complaints unless bolded ?Hoarseness, sore throat, dysphagia, dental problems, itching, sneezing,  nasal congestion or discharge of excess mucus or purulent secretions, ear ache,   fever, chills, sweats, unintended wt loss or wt gain, classically pleuritic or exertional cp,  orthopnea pnd or arm/hand swelling  or leg swelling, presyncope, palpitations, abdominal pain, anorexia, nausea, vomiting, diarrhea  or change in bowel habits or change in  bladder habits, change in stools or change in urine, dysuria, hematuria,  rash, arthralgias, visual complaints, headache, numbness, weakness or ataxia or problems with walking or coordination,  change in mood or  memory. ?      ?   ? ?Past Medical History:  ?Diagnosis Date  ? Depression   ? Hyperlipidemia   ? Hypertension   ? ? ?Outpatient Medications Prior to Visit  ?Medication Sig Dispense Refill  ? amLODipine-valsartan (EXFORGE) 5-160 MG per tablet Take 1 tablet by mouth daily.    ? atorvastatin (LIPITOR) 40 MG tablet     ? chlorthalidone (HYGROTON) 25 MG tablet     ? folic acid (FOLVITE) 400 MCG tablet Take 400 mcg by mouth daily.    ? furosemide (LASIX) 20 MG tablet Take by mouth.    ? LORazepam (ATIVAN) 1 MG tablet Take 1 mg by mouth daily as needed.  2  ? losartan (COZAAR) 100 MG tablet Take by mouth.    ? meloxicam (MOBIC) 15 MG tablet Take 15 mg by mouth daily.    ? metoprolol succinate (TOPROL-XL) 25 MG 24 hr tablet Take 25 mg by mouth daily.    ? Multiple Vitamin (MULTIVITAMIN WITH MINERALS) TABS tablet Take 1 tablet by mouth daily.    ? vitamin E 400 UNIT capsule Take 400 Units by mouth daily.    ? aspirin 325 MG tablet Take 325 mg by mouth daily.    ? Calcium Carbonate-Vitamin D (  CALCIUM 600 + D PO) Take 1 tablet by mouth daily.    ? DUREZOL 0.05 % EMUL Apply 1 drop to eye daily.    ? meclizine (ANTIVERT) 25 MG tablet TAKE 1 TABLET BY MOUTH THREE TIMES DAILY AS NEEDED FOR dizziness  1  ? ?No facility-administered medications prior to visit.  ? ? ? ?Objective:  ?  ? ?BP 134/78 (BP Location: Left Arm, Patient Position: Sitting)   Pulse 62   Temp 98.7 ?F (37.1 ?C) (Temporal)   Ht 5\' 3"  (1.6 m)   Wt 173 lb 9.6 oz (78.7 kg)   SpO2 97% Comment: ra  BMI 30.75 kg/m?  ? ?SpO2: 97 % (ra) ? ?Mod obese amb wf nad ? ? HEENT : pt wearing mask not removed for exam due to covid -19 concerns.  ? ? ?NECK :  without JVD/Nodes/TM/ nl carotid upstrokes bilaterally ? ? ?LUNGS: no acc muscle use,  Nl contour chest which  is clear to A and P bilaterally without cough on insp or exp maneuvers ? ? ?CV:  RRR  no s3 or murmur or increase in P2, and no edema  ? ?ABD:  soft and nontender with nl inspiratory excursion in the supine position. No bruits or organomegaly appreciated, bowel sounds nl ? ?MS:  Nl gait/ ext warm without deformities, calf tenderness, cyanosis or clubbing ?No obvious joint restrictions  ? ?SKIN: warm and dry without lesions   ? ?NEURO:  alert, approp, nl sensorium with  no motor or cerebellar deficits apparent.  ? ? ? ?I personally reviewed images and agree with radiology impression as follows:  ? Chest CTa  01/28/22  ?1. Multiple prominent and mildly enlarged mediastinal and hilar lymph nodes, nonspecific .  Largest 1.3 cm ?Central airways are patent. Dependent atelectasis  ?within the lower lobes bilaterally. Lingular atelectasis ? ?   ?   ?Assessment  ? ?No problem-specific Assessment & Plan notes found for this encounter. ? ? ? ? ?01/30/22, MD ?02/26/2022 ?   ?

## 2022-02-27 ENCOUNTER — Other Ambulatory Visit: Payer: Self-pay | Admitting: Internal Medicine

## 2022-02-27 ENCOUNTER — Ambulatory Visit (INDEPENDENT_AMBULATORY_CARE_PROVIDER_SITE_OTHER): Payer: Medicare Other | Admitting: Internal Medicine

## 2022-02-27 DIAGNOSIS — R0609 Other forms of dyspnea: Secondary | ICD-10-CM

## 2022-02-27 LAB — PULMONARY FUNCTION TEST
DL/VA % pred: 85 %
DL/VA: 3.57 ml/min/mmHg/L
DLCO cor % pred: 72 %
DLCO cor: 12.95 ml/min/mmHg
DLCO unc % pred: 72 %
DLCO unc: 12.95 ml/min/mmHg
FEF 25-75 Post: 1.38 L/sec
FEF 25-75 Pre: 1.15 L/sec
FEF2575-%Change-Post: 19 %
FEF2575-%Pred-Post: 89 %
FEF2575-%Pred-Pre: 74 %
FEV1-%Change-Post: 3 %
FEV1-%Pred-Post: 79 %
FEV1-%Pred-Pre: 76 %
FEV1-Post: 1.52 L
FEV1-Pre: 1.47 L
FEV1FVC-%Change-Post: 3 %
FEV1FVC-%Pred-Pre: 102 %
FEV6-%Change-Post: 0 %
FEV6-%Pred-Post: 78 %
FEV6-%Pred-Pre: 78 %
FEV6-Post: 1.93 L
FEV6-Pre: 1.92 L
FEV6FVC-%Pred-Post: 105 %
FEV6FVC-%Pred-Pre: 105 %
FVC-%Change-Post: 0 %
FVC-%Pred-Post: 74 %
FVC-%Pred-Pre: 74 %
FVC-Post: 1.93 L
FVC-Pre: 1.92 L
Post FEV1/FVC ratio: 79 %
Post FEV6/FVC ratio: 100 %
Pre FEV1/FVC ratio: 77 %
Pre FEV6/FVC Ratio: 100 %

## 2022-02-27 NOTE — Progress Notes (Signed)
PFT done today. 

## 2022-02-28 ENCOUNTER — Encounter: Payer: Self-pay | Admitting: Internal Medicine

## 2022-02-28 LAB — CBC WITH DIFFERENTIAL/PLATELET
Basophils Absolute: 0.1 10*3/uL (ref 0.0–0.2)
Basos: 1 %
EOS (ABSOLUTE): 0.2 10*3/uL (ref 0.0–0.4)
Eos: 3 %
Hematocrit: 39.6 % (ref 34.0–46.6)
Hemoglobin: 13.1 g/dL (ref 11.1–15.9)
Immature Grans (Abs): 0 10*3/uL (ref 0.0–0.1)
Immature Granulocytes: 0 %
Lymphocytes Absolute: 2.1 10*3/uL (ref 0.7–3.1)
Lymphs: 27 %
MCH: 30.9 pg (ref 26.6–33.0)
MCHC: 33.1 g/dL (ref 31.5–35.7)
MCV: 93 fL (ref 79–97)
Monocytes Absolute: 0.4 10*3/uL (ref 0.1–0.9)
Monocytes: 5 %
Neutrophils Absolute: 5.1 10*3/uL (ref 1.4–7.0)
Neutrophils: 64 %
Platelets: 203 10*3/uL (ref 150–450)
RBC: 4.24 x10E6/uL (ref 3.77–5.28)
RDW: 13.3 % (ref 11.7–15.4)
WBC: 7.9 10*3/uL (ref 3.4–10.8)

## 2022-02-28 LAB — IGE: IgE (Immunoglobulin E), Serum: 4 IU/mL — ABNORMAL LOW (ref 6–495)

## 2022-02-28 LAB — D-DIMER, QUANTITATIVE: D-DIMER: 0.73 mg/L FEU — ABNORMAL HIGH (ref 0.00–0.49)

## 2022-02-28 LAB — BRAIN NATRIURETIC PEPTIDE: BNP: 479.1 pg/mL — ABNORMAL HIGH (ref 0.0–100.0)

## 2022-02-28 LAB — TSH: TSH: 3.15 u[IU]/mL (ref 0.450–4.500)

## 2022-02-28 LAB — SEDIMENTATION RATE: Sed Rate: 28 mm/hr (ref 0–40)

## 2022-02-28 NOTE — Assessment & Plan Note (Addendum)
Onset fall 2022 assoc with wt gain and leg swelling  ?- CTa 01/28/22 no ild/ no pe  Nonspecific adenopathy and basilar/dep atx  ?- 02/26/2022   Walked on RA  x  2  lap(s) =  approx 300  ft  @ mod pace, stopped due to being unsteady> sob p 2nd lap with lowest 02 sats 94% ? ?Symptoms are   disproportionate to objective findings and not clear to what extent this is actually a pulmonary  problem but pt does appear to have difficult to sort out respiratory symptoms of unknown origin for which  DDX  = almost all start with A and  include Adherence, Ace Inhibitors, Acid Reflux, Active Sinus Disease, Alpha 1 Antitripsin deficiency, Anxiety masquerading as Airways dz,  ABPA,  Allergy(esp in young), Aspiration (esp in elderly), Adverse effects of meds,  Active smoking or Vaping, A bunch of PE's/clot burden (a few small clots can't cause this syndrome unless there is already severe underlying pulm or vascular dz with poor reserve),  Anemia or thyroid disorder, plus two Bs  = Bronchiectasis and Beta blocker use..and one C= CHF  ? ?Adherence is always the initial "prime suspect" and is a multilayered concern that requires a "trust but verify" approach in every patient - starting with knowing how to use medications, especially inhalers, correctly, keeping up with refills and understanding the fundamental difference between maintenance and prns vs those medications only taken for a very short course and then stopped and not refilled.  ? ?? Allergy /asthma/ copd  ? ?When respiratory symptoms begin or become refractory well after a patient reports complete smoking cessation,  Especially when this wasn't the case while they were smoking, a red flag is raised based on the work of Dr Primitivo Gauze which states: ? if you quit smoking when your best day FEV1 is still well preserved it is highly unlikely you will progress to severe disease. ? ?That is to say, once the smoking stops,  the symptoms should not suddenly erupt or markedly worsen.  If  so, the differential diagnosis should include  obesity/deconditioning,  LPR/Reflux/Aspiration syndromes,  occult CHF, or  especially side effect of medications commonly used in this population.   ? >>> check pfts before and after bronchodilators ?>>>  Reconditioning at sub max ex levels rec daily building up to 30 min as tol ? ?? Anemia/thyroid disorder:  ?- hgb 13.1 and TSH 3.150 today  ? ?Adverse drug effects > none of the usual suspects listed ? ? ?? A bunch of PE's - d dimer 0.73  >>>   while a   high normal value (seen commonly in the elderly or chronically ill)  may miss small peripheral pe, the clot burden with sob is moderately high and the d dimer  has a very high neg pred value if used in this setting.   ? ?? chf > doe assoc with leg swelling and  BNP 479 today  so echo next step ? ? ?Each maintenance medication was reviewed in detail including emphasizing most importantly the difference between maintenance and prns and under what circumstances the prns are to be triggered using an action plan format where appropriate. ? ?Total time for H and P, chart review, counseling,  directly observing portions of ambulatory 02 saturation study/ and generating customized AVS unique to this office visit / same day charting = 45 min with pt new to me ?     ?  ?       ?

## 2022-03-02 ENCOUNTER — Other Ambulatory Visit: Payer: Self-pay

## 2022-03-05 DIAGNOSIS — E7800 Pure hypercholesterolemia, unspecified: Secondary | ICD-10-CM | POA: Diagnosis not present

## 2022-03-05 DIAGNOSIS — I10 Essential (primary) hypertension: Secondary | ICD-10-CM | POA: Diagnosis not present

## 2022-03-11 ENCOUNTER — Ambulatory Visit (INDEPENDENT_AMBULATORY_CARE_PROVIDER_SITE_OTHER): Payer: Medicare Other

## 2022-03-11 DIAGNOSIS — R0609 Other forms of dyspnea: Secondary | ICD-10-CM | POA: Diagnosis not present

## 2022-03-11 LAB — ECHOCARDIOGRAM COMPLETE
Area-P 1/2: 3.21 cm2
MV M vel: 6.26 m/s
MV Peak grad: 156.8 mmHg
P 1/2 time: 381 msec
Radius: 0.5 cm
S' Lateral: 3.42 cm

## 2022-03-12 ENCOUNTER — Other Ambulatory Visit: Payer: Self-pay

## 2022-03-12 DIAGNOSIS — I38 Endocarditis, valve unspecified: Secondary | ICD-10-CM

## 2022-03-23 ENCOUNTER — Institutional Professional Consult (permissible substitution): Payer: Medicare Other | Admitting: Internal Medicine

## 2022-03-27 DIAGNOSIS — E7800 Pure hypercholesterolemia, unspecified: Secondary | ICD-10-CM | POA: Diagnosis not present

## 2022-03-27 DIAGNOSIS — Z299 Encounter for prophylactic measures, unspecified: Secondary | ICD-10-CM | POA: Diagnosis not present

## 2022-03-27 DIAGNOSIS — Z0000 Encounter for general adult medical examination without abnormal findings: Secondary | ICD-10-CM | POA: Diagnosis not present

## 2022-03-27 DIAGNOSIS — I10 Essential (primary) hypertension: Secondary | ICD-10-CM | POA: Diagnosis not present

## 2022-03-27 DIAGNOSIS — Z1211 Encounter for screening for malignant neoplasm of colon: Secondary | ICD-10-CM | POA: Diagnosis not present

## 2022-04-02 ENCOUNTER — Ambulatory Visit: Payer: Medicare Other | Admitting: Cardiology

## 2022-04-02 ENCOUNTER — Encounter: Payer: Self-pay | Admitting: Cardiology

## 2022-04-02 VITALS — BP 110/60 | HR 78 | Ht 63.0 in | Wt 174.2 lb

## 2022-04-02 DIAGNOSIS — R0600 Dyspnea, unspecified: Secondary | ICD-10-CM | POA: Diagnosis not present

## 2022-04-02 DIAGNOSIS — I10 Essential (primary) hypertension: Secondary | ICD-10-CM | POA: Diagnosis not present

## 2022-04-02 DIAGNOSIS — R0609 Other forms of dyspnea: Secondary | ICD-10-CM

## 2022-04-02 DIAGNOSIS — Z79899 Other long term (current) drug therapy: Secondary | ICD-10-CM

## 2022-04-02 NOTE — Patient Instructions (Signed)
Medication Instructions:  ?Continue all current medications. ? ?Labwork: ?none ? ?Testing/Procedures: ?BMET, BNP, MG - orders given today ?Office will contact with results via phone or letter.    ? ?Follow-Up: ?4 months  ? ?Any Other Special Instructions Will Be Listed Below (If Applicable). ? ? ?If you need a refill on your cardiac medications before your next appointment, please call your pharmacy. ? ?

## 2022-04-02 NOTE — Progress Notes (Signed)
? ? ? ?Clinical Summary ?Ms. Maple is a 76 y.o.female seen today as a new consult, referred by Dr Sherene Sires for the following medical problesm.  ? ?1.DOE ?- seen by pulmonary ?- 02/2022 PFTs: minimal obstruction and decreased diffusion, moderate restriction ?-03/2022 echo: LVEF 65-70%, no WMAs, normal diastolic function, normal RV, mod pulm HTN PASP 46, mod LAE, mild MR, mod TR, RA pressure 8 mmHg ? ? ?- SOB started about 6 months ago ?- has had some leg swelling for long time ?- some wheezing at times ?- mild orthopnea ?- has been on lasix 20mg  x 2 months, started by pcp. No improvement in symptoms. 01/2022 Cr was 1.62 ?- weight again after head bleed 03/2021. Also limited by chronic back pain, leg pain. Up 15 lbs since 03/2021 when admitted with brain bleed ?- no chest pains.  ?- 02/2022 BNP 479 01/2022 nospecific lymph nodes ? ? ? ? ? ?2. History of intraventricular hemorrhage ?- admit 03/2021 [facility] ?- from notes nontraumatic bleed, was on ASA at the time ? ?3. Palpitaitons ?- infrequent ? ?Past Medical History:  ?Diagnosis Date  ? Depression   ? Hyperlipidemia   ? Hypertension   ? ? ? ?Allergies  ?Allergen Reactions  ? Codeine Anaphylaxis and Shortness Of Breath  ? ? ? ?Current Outpatient Medications  ?Medication Sig Dispense Refill  ? amLODipine-valsartan (EXFORGE) 5-160 MG per tablet Take 1 tablet by mouth daily.    ? atorvastatin (LIPITOR) 40 MG tablet     ? chlorthalidone (HYGROTON) 25 MG tablet     ? folic acid (FOLVITE) 400 MCG tablet Take 400 mcg by mouth daily.    ? furosemide (LASIX) 20 MG tablet Take by mouth.    ? LORazepam (ATIVAN) 1 MG tablet Take 1 mg by mouth daily as needed.  2  ? losartan (COZAAR) 100 MG tablet Take by mouth.    ? meloxicam (MOBIC) 15 MG tablet Take 15 mg by mouth daily.    ? metoprolol succinate (TOPROL-XL) 25 MG 24 hr tablet Take 25 mg by mouth daily.    ? Multiple Vitamin (MULTIVITAMIN WITH MINERALS) TABS tablet Take 1 tablet by mouth daily.    ? vitamin E 400 UNIT capsule Take 400  Units by mouth daily.    ? ?No current facility-administered medications for this visit.  ? ? ? ?Past Surgical History:  ?Procedure Laterality Date  ? APPENDECTOMY  1975  ? BACK SURGERY  2011  ? CATARACT EXTRACTION W/PHACO Right 08/28/2013  ? Procedure: CATARACT EXTRACTION PHACO AND INTRAOCULAR LENS PLACEMENT (IOC);  Surgeon: 08/30/2013, MD;  Location: [facility];  Service: Ophthalmology;  Laterality: Right;  CDE:  9.15  ? CATARACT EXTRACTION W/PHACO Left 09/07/2013  ? Procedure: CATARACT EXTRACTION PHACO AND INTRAOCULAR LENS PLACEMENT (IOC);  Surgeon: 11/07/2013, MD;  Location: [facility];  Service: Ophthalmology;  Laterality: Left;  CDE:8.47  ? NECK SURGERY  1966  ? RENAL ARTERY STENT  2000  ? TONSILLECTOMY  1958  ? TUBAL LIGATION  1975  ? WRIST SURGERY Right 2005  ? ? ? ?Allergies  ?Allergen Reactions  ? Codeine Anaphylaxis and Shortness Of Breath  ? ? ? ? ?Family History  ?Problem Relation Age of Onset  ? Breast cancer Neg Hx   ? ? ? ?Social History ?Ms. Catalano reports that she quit smoking about 10 years ago. Her smoking use included cigarettes. She has never used smokeless tobacco. ?Ms. Sabine reports no history of alcohol use. ? ? ?Review of Systems ?CONSTITUTIONAL:  No weight loss, fever, chills, weakness or fatigue.  ?HEENT: Eyes: No visual loss, blurred vision, double vision or yellow sclerae.No hearing loss, sneezing, congestion, runny nose or sore throat.  ?SKIN: No rash or itching.  ?CARDIOVASCULAR: per hpi ?RESPIRATORY: per hpi ?GASTROINTESTINAL: No anorexia, nausea, vomiting or diarrhea. No abdominal pain or blood.  ?GENITOURINARY: No burning on urination, no polyuria ?NEUROLOGICAL: No headache, dizziness, syncope, paralysis, ataxia, numbness or tingling in the extremities. No change in bowel or bladder control.  ?MUSCULOSKELETAL: No muscle, back pain, joint pain or stiffness.  ?LYMPHATICS: No enlarged nodes. No history of splenectomy.  ?PSYCHIATRIC: No history of depression or anxiety.  ?ENDOCRINOLOGIC: No  reports of sweating, cold or heat intolerance. No polyuria or polydipsia.  ?. ? ? ?Physical Examination ?Today's Vitals  ? 04/02/22 1018 04/02/22 1045  ?BP: (!) 80/50 (!) 91/55  ?Pulse: 78 78  ?SpO2: 93%   ?Weight: 174 lb 3.2 oz (79 kg)   ?Height: 5\' 3"  (1.6 m)   ? ?Body mass index is 30.86 kg/m?. ? ?Gen: resting comfortably, no acute distress ?HEENT: no scleral icterus, pupils equal round and reactive, no palptable cervical adenopathy,  ?CV: RRR, 2/6 systoilc murmur apex ?Resp: Clear to auscultation bilaterally ?GI: abdomen is soft, non-tender, non-distended, normal bowel sounds, no hepatosplenomegaly ?MSK: extremities are warm, no edema.  ?Skin: warm, no rash ?Neuro:  no focal deficits ?Psych: appropriate affect ? ? ?Diagnostic Studies ?03/2021 echo [facility] ?Left Ventricle: EF: 55-60%.  ?  Pericardium: There is a small pericardial effusion noted. ?Narrative ? ?This result has an attachment that is not available.  ?  ?Left Ventricle  ?Left ventricle size is normal. There is moderate hypertrophy. EF: 55-60%. Wall motion is normal. Doppler parameters are indeterminate for diastolic function.  ? ?Right Ventricle  ?Right ventricle is normal. Systolic function is normal.  ? ?Left Atrium  ?Left atrium is normal in size.  ? ?Right Atrium  ?Normal sized right atrium.  ? ?IVC/SVC  ?The inferior vena cava demonstrates a diameter of <=2.1 cm and collapses >50%; therefore, the right atrial pressure is estimated at 3 mmHg.  ? ?Mitral Valve  ?There is moderate posterior annular calcification. There is mild to moderate regurgitation. There is no evidence of mitral valve stenosis.  ? ?Tricuspid Valve  ?The leaflets exhibit normal excursion. There is mild regurgitation. The right ventricular systolic pressure is normal (<36 mmHg).  ? ?Aortic Valve  ?The leaflets are moderately thickened. Trace aortic valve regurgitation. There is no evidence of aortic valve stenosis.  ? ?Pulmonic Valve  ?Pulmonic valve with normal excursion. Trace  regurgitation.  ? ?Ascending Aorta  ?The aortic root is normal in size.  ? ?Pericardium  ?There is a small pericardial effusion noted.  ? ?Study Details  ?A complete echo was performed using complete 2D, color flow Doppler and spectral Doppler. During the study the apical, parasternal and subcostal view was captured. Overall the study quality was adequate. ? ? ? ?03/2022 Echo ?1. Left ventricular ejection fraction, by estimation, is 65 to 70%. The  ?left ventricle has normal function. The left ventricle has no regional  ?wall motion abnormalities. Left ventricular diastolic parameters were  ?normal.  ? 2. Right ventricular systolic function is normal. The right ventricular  ?size is normal. There is moderately elevated pulmonary artery systolic  ?pressure. The estimated right ventricular systolic pressure is 46.2 mmHg.  ? 3. Left atrial size was moderately dilated.  ? 4. A small pericardial effusion is present. The pericardial effusion is  ?posterior  to the left ventricle.  ? 5. The mitral valve is abnormal. Mild mitral valve regurgitation.  ?Moderate mitral annular calcification.  ? 6. Tricuspid valve regurgitation is moderate.  ? 7. The aortic valve is tricuspid. There is moderate calcification of the  ?aortic valve. Aortic valve regurgitation is mild. Aortic valve  ?sclerosis/calcification is present, without any evidence of aortic  ?stenosis.  ? 8. The inferior vena cava is dilated in size with >50% respiratory  ?variability, suggesting right atrial pressure of 8 mmHg.  ? ?Comparison(s): No prior Echocardiogram. ? ? ? ?Assessment and Plan  ?1.DOE ?- unclear etiology. LIkely multifactorial with weight gain and deconditioning followed head bleed last year and resultant decrease in activity. Echo with mild valvular disease would not be contributing,. Normal diastolic paramaters reported but low e's and elevated E/e' with LAE support elevated LA pressure.  BNP was elevated, has been on lasix ?- recheck bmet/bnp, Cr  was up by 01/2022 labs. Pending renal functino may increase lasix further ? ? ?2.HTN ?- at goal, continue current meds ? ? ? ? ? ?Antoine PocheJonathan F. Nema Oatley, M.D., ?

## 2022-04-05 DIAGNOSIS — I10 Essential (primary) hypertension: Secondary | ICD-10-CM | POA: Diagnosis not present

## 2022-04-16 ENCOUNTER — Ambulatory Visit: Payer: Medicare Other | Admitting: Cardiology

## 2022-04-29 DIAGNOSIS — Z713 Dietary counseling and surveillance: Secondary | ICD-10-CM | POA: Diagnosis not present

## 2022-04-29 DIAGNOSIS — I10 Essential (primary) hypertension: Secondary | ICD-10-CM | POA: Diagnosis not present

## 2022-04-29 DIAGNOSIS — Z299 Encounter for prophylactic measures, unspecified: Secondary | ICD-10-CM | POA: Diagnosis not present

## 2022-04-29 DIAGNOSIS — M549 Dorsalgia, unspecified: Secondary | ICD-10-CM | POA: Diagnosis not present

## 2022-05-05 DIAGNOSIS — I10 Essential (primary) hypertension: Secondary | ICD-10-CM | POA: Diagnosis not present

## 2022-06-01 DIAGNOSIS — I10 Essential (primary) hypertension: Secondary | ICD-10-CM | POA: Diagnosis not present

## 2022-06-01 DIAGNOSIS — Z299 Encounter for prophylactic measures, unspecified: Secondary | ICD-10-CM | POA: Diagnosis not present

## 2022-06-01 DIAGNOSIS — Z713 Dietary counseling and surveillance: Secondary | ICD-10-CM | POA: Diagnosis not present

## 2022-06-01 DIAGNOSIS — M549 Dorsalgia, unspecified: Secondary | ICD-10-CM | POA: Diagnosis not present

## 2022-06-04 DIAGNOSIS — I10 Essential (primary) hypertension: Secondary | ICD-10-CM | POA: Diagnosis not present

## 2022-06-08 ENCOUNTER — Other Ambulatory Visit (HOSPITAL_COMMUNITY): Payer: Self-pay | Admitting: Internal Medicine

## 2022-06-08 ENCOUNTER — Other Ambulatory Visit: Payer: Self-pay | Admitting: Internal Medicine

## 2022-06-08 DIAGNOSIS — M545 Low back pain, unspecified: Secondary | ICD-10-CM

## 2022-06-15 DIAGNOSIS — Z961 Presence of intraocular lens: Secondary | ICD-10-CM | POA: Diagnosis not present

## 2022-06-15 DIAGNOSIS — H3562 Retinal hemorrhage, left eye: Secondary | ICD-10-CM | POA: Diagnosis not present

## 2022-06-19 ENCOUNTER — Ambulatory Visit (HOSPITAL_COMMUNITY)
Admission: RE | Admit: 2022-06-19 | Discharge: 2022-06-19 | Disposition: A | Discharge status: Home / Self Care | Payer: Medicare Other | Source: Ambulatory Visit | Attending: Internal Medicine | Admitting: Internal Medicine

## 2022-06-19 DIAGNOSIS — M47816 Spondylosis without myelopathy or radiculopathy, lumbar region: Secondary | ICD-10-CM | POA: Diagnosis not present

## 2022-06-19 DIAGNOSIS — M545 Low back pain, unspecified: Secondary | ICD-10-CM | POA: Diagnosis not present

## 2022-06-19 DIAGNOSIS — M5126 Other intervertebral disc displacement, lumbar region: Secondary | ICD-10-CM | POA: Diagnosis not present

## 2022-07-01 DIAGNOSIS — Z299 Encounter for prophylactic measures, unspecified: Secondary | ICD-10-CM | POA: Diagnosis not present

## 2022-07-01 DIAGNOSIS — M549 Dorsalgia, unspecified: Secondary | ICD-10-CM | POA: Diagnosis not present

## 2022-07-01 DIAGNOSIS — I10 Essential (primary) hypertension: Secondary | ICD-10-CM | POA: Diagnosis not present

## 2022-07-01 DIAGNOSIS — Z713 Dietary counseling and surveillance: Secondary | ICD-10-CM | POA: Diagnosis not present

## 2022-07-06 DIAGNOSIS — I10 Essential (primary) hypertension: Secondary | ICD-10-CM | POA: Diagnosis not present

## 2022-07-17 DIAGNOSIS — M48062 Spinal stenosis, lumbar region with neurogenic claudication: Secondary | ICD-10-CM | POA: Diagnosis not present

## 2022-08-05 ENCOUNTER — Encounter: Payer: Self-pay | Admitting: Cardiology

## 2022-08-05 ENCOUNTER — Ambulatory Visit: Payer: Medicare Other | Attending: Cardiology | Admitting: Cardiology

## 2022-08-05 VITALS — BP 130/70 | HR 72 | Ht 63.0 in | Wt 174.6 lb

## 2022-08-05 DIAGNOSIS — R0609 Other forms of dyspnea: Secondary | ICD-10-CM | POA: Diagnosis not present

## 2022-08-05 NOTE — Progress Notes (Addendum)
Clinical Summary Ms. Stiggers is a 76 y.o.female seen today for follow up of the following medical problems.   1.DOE - seen by pulmonary - 02/2022 PFTs: minimal obstruction and decreased diffusion, moderate restriction -03/2022 echo: LVEF 65-70%, no WMAs, normal diastolic function, normal RV, mod pulm HTN PASP 46, mod LAE, mild MR, mod TR, RA pressure 8 mmHg     - SOB started about 6 months ago - has had some leg swelling for long time - some wheezing at times - mild orthopnea - has been on lasix 20mg  x 2 months, started by pcp. No improvement in symptoms. 01/2022 Cr was 1.62 - weight again after head bleed 03/2021. Also limited by chronic back pain, leg pain. Up 15 lbs since 03/2021 when admitted with brain bleed - no chest pains.  - 02/2022 BNP 479 01/2022 nospecific lymph nodes      - breathing is worsening. DOE with just walking short distances.  - some recent leg swelling. Weights are stable by clinic numbers, home weight stable. - DOE less than 1 flight of stairs - she reports pcp stopped lasix due to renal dysfunction, has labs Friday.        2. History of intraventricular hemorrhage - admit 03/2021 [facility] - from notes nontraumatic bleed, was on ASA at the time    3. Preop evaluation - L1-L2 laminectomy pending with Dr 04/2021  Past Medical History:  Diagnosis Date   Depression    Hyperlipidemia    Hypertension      Allergies  Allergen Reactions   Codeine Anaphylaxis and Shortness Of Breath     Current Outpatient Medications  Medication Sig Dispense Refill   amLODipine (NORVASC) 10 MG tablet Take 10 mg by mouth daily.     atorvastatin (LIPITOR) 40 MG tablet Take 40 mg by mouth daily.     celecoxib (CELEBREX) 200 MG capsule Take 200 mg by mouth daily.     LORazepam (ATIVAN) 1 MG tablet Take 1 mg by mouth daily as needed.  2   losartan (COZAAR) 100 MG tablet Take 100 mg by mouth daily.     metoprolol succinate (TOPROL-XL) 25 MG 24 hr tablet Take  25 mg by mouth daily.     Multiple Vitamin (MULTIVITAMIN WITH MINERALS) TABS tablet Take 1 tablet by mouth daily.     folic acid (FOLVITE) 400 MCG tablet Take 400 mcg by mouth daily. (Patient not taking: Reported on 08/05/2022)     No current facility-administered medications for this visit.     Past Surgical History:  Procedure Laterality Date   APPENDECTOMY  1975   BACK SURGERY  2011   CATARACT EXTRACTION W/PHACO Right 08/28/2013   Procedure: CATARACT EXTRACTION PHACO AND INTRAOCULAR LENS PLACEMENT (IOC);  Surgeon: 08/30/2013, MD;  Location: [facility];  Service: Ophthalmology;  Laterality: Right;  CDE:  9.15   CATARACT EXTRACTION W/PHACO Left 09/07/2013   Procedure: CATARACT EXTRACTION PHACO AND INTRAOCULAR LENS PLACEMENT (IOC);  Surgeon: 11/07/2013, MD;  Location: [facility];  Service: Ophthalmology;  Laterality: Left;  CDE:8.47   NECK SURGERY  1966   RENAL ARTERY STENT  2000   TONSILLECTOMY  1958   TUBAL LIGATION  1975   WRIST SURGERY Right 2005     Allergies  Allergen Reactions   Codeine Anaphylaxis and Shortness Of Breath      Family History  Problem Relation Age of Onset   Breast cancer Neg Hx      Social History  Ms. Laird reports that she quit smoking about 10 years ago. Her smoking use included cigarettes. She has never used smokeless tobacco. Ms. Nienhuis reports no history of alcohol use.   Review of Systems CONSTITUTIONAL: No weight loss, fever, chills, weakness or fatigue.  HEENT: Eyes: No visual loss, blurred vision, double vision or yellow sclerae.No hearing loss, sneezing, congestion, runny nose or sore throat.  SKIN: No rash or itching.  CARDIOVASCULAR: per hpi RESPIRATORY: No shortness of breath, cough or sputum.  GASTROINTESTINAL: No anorexia, nausea, vomiting or diarrhea. No abdominal pain or blood.  GENITOURINARY: No burning on urination, no polyuria NEUROLOGICAL: No headache, dizziness, syncope, paralysis, ataxia, numbness or tingling in the extremities.  No change in bowel or bladder control.  MUSCULOSKELETAL: No muscle, back pain, joint pain or stiffness.  LYMPHATICS: No enlarged nodes. No history of splenectomy.  PSYCHIATRIC: No history of depression or anxiety.  ENDOCRINOLOGIC: No reports of sweating, cold or heat intolerance. No polyuria or polydipsia.  Marland Kitchen   Physical Examination Today's Vitals   08/05/22 1446 08/05/22 1548  BP: (!) 160/70 130/70  Pulse: 72   SpO2: 95%   Weight: 174 lb 9.6 oz (79.2 kg)   Height: 5\' 3"  (1.6 m)    Body mass index is 30.93 kg/m.  Gen: resting comfortably, no acute distress HEENT: no scleral icterus, pupils equal round and reactive, no palptable cervical adenopathy,  CV: RRR, no mr/g no jvd Resp: Clear to auscultation bilaterally GI: abdomen is soft, non-tender, non-distended, normal bowel sounds, no hepatosplenomegaly MSK: extremities are warm, no edema.  Skin: warm, no rash Neuro:  no focal deficits Psych: appropriate affect   Diagnostic Studies 03/2021 echo [facility] Left Ventricle: EF: 55-60%.    Pericardium: There is a small pericardial effusion noted. Narrative   This result has an attachment that is not available.    Left Ventricle  Left ventricle size is normal. There is moderate hypertrophy. EF: 55-60%. Wall motion is normal. Doppler parameters are indeterminate for diastolic function.   Right Ventricle  Right ventricle is normal. Systolic function is normal.   Left Atrium  Left atrium is normal in size.   Right Atrium  Normal sized right atrium.   IVC/SVC  The inferior vena cava demonstrates a diameter of <=2.1 cm and collapses >50%; therefore, the right atrial pressure is estimated at 3 mmHg.   Mitral Valve  There is moderate posterior annular calcification. There is mild to moderate regurgitation. There is no evidence of mitral valve stenosis.   Tricuspid Valve  The leaflets exhibit normal excursion. There is mild regurgitation. The right ventricular systolic pressure  is normal (04/2021 mmHg).   Aortic Valve  The leaflets are moderately thickened. Trace aortic valve regurgitation. There is no evidence of aortic valve stenosis.   Pulmonic Valve  Pulmonic valve with normal excursion. Trace regurgitation.   Ascending Aorta  The aortic root is normal in size.   Pericardium  There is a small pericardial effusion noted.   Study Details  A complete echo was performed using complete 2D, color flow Doppler and spectral Doppler. During the study the apical, parasternal and subcostal view was captured. Overall the study quality was adequate.       03/2022 Echo 1. Left ventricular ejection fraction, by estimation, is 65 to 70%. The  left ventricle has normal function. The left ventricle has no regional  wall motion abnormalities. Left ventricular diastolic parameters were  normal.   2. Right ventricular systolic function is normal. The right ventricular  size is normal. There is moderately elevated pulmonary artery systolic  pressure. The estimated right ventricular systolic pressure is [id number] mmHg.   3. Left atrial size was moderately dilated.   4. A small pericardial effusion is present. The pericardial effusion is  posterior to the left ventricle.   5. The mitral valve is abnormal. Mild mitral valve regurgitation.  Moderate mitral annular calcification.   6. Tricuspid valve regurgitation is moderate.   7. The aortic valve is tricuspid. There is moderate calcification of the  aortic valve. Aortic valve regurgitation is mild. Aortic valve  sclerosis/calcification is present, without any evidence of aortic  stenosis.   8. The inferior vena cava is dilated in size with >50% respiratory  variability, suggesting right atrial pressure of 8 mmHg.   Comparison(s): No prior Echocardiogram.    Assessment and Plan  1.DOE - unclear etiology, progressing - echo with normal LVEF, did have elevated LA pressure and LAE. Moderate pulmonary HTN.  - seen by pulm  without clear etiology it appears - has had fairly significantly elevated bnp's on labs. Diuretic limited by renal dysfunction, most recently she reports pcp discontinued  - would consider RHC given progression DOE, elevated LA pressure and pulm HTN by echo, elevated BNPs with diuresis limited by renal dysfunction. Has labs pending with pcp, pending Cr could consider looking at coronaries as well given her DOE however on chart review had a spontaneous brain bleed on ASA in the past so poor DAPT candidate even if she has disease and thus will proceed with just RHC.  Would postpone back surgery until further cardiac workup is complete I have called and left a message with Dr Manon Hilding office about postponing the surgery.    2. History of intraventricular hemorrhage - admit 03/2021 [facility] - from notes nontraumatic bleed, was on ASA at the time  Arnoldo Lenis, M.D.

## 2022-08-05 NOTE — Patient Instructions (Signed)
Medication Instructions:   Your physician recommends that you continue on your current medications as directed. Please refer to the Current Medication list given to you today.  Labwork:  none  Testing/Procedures:  none  Follow-Up:  Your physician recommends that you schedule a follow-up appointment in: 3 months.  Any Other Special Instructions Will Be Listed Below (If Applicable).  If you need a refill on your cardiac medications before your next appointment, please call your pharmacy. 

## 2022-08-05 NOTE — H&P (View-Only) (Signed)
Clinical Summary Nicole Nash is a 76 y.o.female seen today for follow up of the following medical problems.   1.DOE - seen by pulmonary - 02/2022 PFTs: minimal obstruction and decreased diffusion, moderate restriction -03/2022 echo: LVEF 65-70%, no WMAs, normal diastolic function, normal RV, mod pulm HTN PASP 46, mod LAE, mild MR, mod TR, RA pressure 8 mmHg     - SOB started about 6 months ago - has had some leg swelling for long time - some wheezing at times - mild orthopnea - has been on lasix 20mg  x 2 months, started by pcp. No improvement in symptoms. 01/2022 Cr was 1.62 - weight again after head bleed 03/2021. Also limited by chronic back pain, leg pain. Up 15 lbs since 03/2021 when admitted with brain bleed - no chest pains.  - 02/2022 BNP 479 01/2022 nospecific lymph nodes      - breathing is worsening. DOE with just walking short distances.  - some recent leg swelling. Weights are stable by clinic numbers, home weight stable. - DOE less than 1 flight of stairs - she reports pcp stopped lasix due to renal dysfunction, has labs Friday.        2. History of intraventricular hemorrhage - admit 03/2021 [facility] - from notes nontraumatic bleed, was on ASA at the time    3. Preop evaluation - L1-L2 laminectomy pending with Dr 04/2021  Past Medical History:  Diagnosis Date   Depression    Hyperlipidemia    Hypertension      Allergies  Allergen Reactions   Codeine Anaphylaxis and Shortness Of Breath     Current Outpatient Medications  Medication Sig Dispense Refill   amLODipine (NORVASC) 10 MG tablet Take 10 mg by mouth daily.     atorvastatin (LIPITOR) 40 MG tablet Take 40 mg by mouth daily.     celecoxib (CELEBREX) 200 MG capsule Take 200 mg by mouth daily.     LORazepam (ATIVAN) 1 MG tablet Take 1 mg by mouth daily as needed.  2   losartan (COZAAR) 100 MG tablet Take 100 mg by mouth daily.     metoprolol succinate (TOPROL-XL) 25 MG 24 hr tablet Take  25 mg by mouth daily.     Multiple Vitamin (MULTIVITAMIN WITH MINERALS) TABS tablet Take 1 tablet by mouth daily.     folic acid (FOLVITE) 400 MCG tablet Take 400 mcg by mouth daily. (Patient not taking: Reported on 08/05/2022)     No current facility-administered medications for this visit.     Past Surgical History:  Procedure Laterality Date   APPENDECTOMY  1975   BACK SURGERY  2011   CATARACT EXTRACTION W/PHACO Right 08/28/2013   Procedure: CATARACT EXTRACTION PHACO AND INTRAOCULAR LENS PLACEMENT (IOC);  Surgeon: 08/30/2013, MD;  Location: [facility];  Service: Ophthalmology;  Laterality: Right;  CDE:  9.15   CATARACT EXTRACTION W/PHACO Left 09/07/2013   Procedure: CATARACT EXTRACTION PHACO AND INTRAOCULAR LENS PLACEMENT ([facility]);  Surgeon: 11/07/2013, MD;  Location: [facility];  Service: Ophthalmology;  Laterality: Left;  CDE:8.47   NECK SURGERY  1966   RENAL ARTERY STENT  2000   TONSILLECTOMY  1958   TUBAL LIGATION  1975   WRIST SURGERY Right 2005     Allergies  Allergen Reactions   Codeine Anaphylaxis and Shortness Of Breath      Family History  Problem Relation Age of Onset   Breast cancer Neg Hx      Social History  Nicole Nash reports that she quit smoking about 10 years ago. Her smoking use included cigarettes. She has never used smokeless tobacco. Nicole Nash reports no history of alcohol use.   Review of Systems CONSTITUTIONAL: No weight loss, fever, chills, weakness or fatigue.  HEENT: Eyes: No visual loss, blurred vision, double vision or yellow sclerae.No hearing loss, sneezing, congestion, runny nose or sore throat.  SKIN: No rash or itching.  CARDIOVASCULAR: per hpi RESPIRATORY: No shortness of breath, cough or sputum.  GASTROINTESTINAL: No anorexia, nausea, vomiting or diarrhea. No abdominal pain or blood.  GENITOURINARY: No burning on urination, no polyuria NEUROLOGICAL: No headache, dizziness, syncope, paralysis, ataxia, numbness or tingling in the extremities.  No change in bowel or bladder control.  MUSCULOSKELETAL: No muscle, back pain, joint pain or stiffness.  LYMPHATICS: No enlarged nodes. No history of splenectomy.  PSYCHIATRIC: No history of depression or anxiety.  ENDOCRINOLOGIC: No reports of sweating, cold or heat intolerance. No polyuria or polydipsia.  Marland Kitchen   Physical Examination Today's Vitals   08/05/22 1446 08/05/22 1548  BP: (!) 160/70 130/70  Pulse: 72   SpO2: 95%   Weight: 174 lb 9.6 oz (79.2 kg)   Height: 5\' 3"  (1.6 m)    Body mass index is 30.93 kg/m.  Gen: resting comfortably, no acute distress HEENT: no scleral icterus, pupils equal round and reactive, no palptable cervical adenopathy,  CV: RRR, no mr/g no jvd Resp: Clear to auscultation bilaterally GI: abdomen is soft, non-tender, non-distended, normal bowel sounds, no hepatosplenomegaly MSK: extremities are warm, no edema.  Skin: warm, no rash Neuro:  no focal deficits Psych: appropriate affect   Diagnostic Studies 03/2021 echo [facility] Left Ventricle: EF: 55-60%.    Pericardium: There is a small pericardial effusion noted. Narrative   This result has an attachment that is not available.    Left Ventricle  Left ventricle size is normal. There is moderate hypertrophy. EF: 55-60%. Wall motion is normal. Doppler parameters are indeterminate for diastolic function.   Right Ventricle  Right ventricle is normal. Systolic function is normal.   Left Atrium  Left atrium is normal in size.   Right Atrium  Normal sized right atrium.   IVC/SVC  The inferior vena cava demonstrates a diameter of <=2.1 cm and collapses >50%; therefore, the right atrial pressure is estimated at 3 mmHg.   Mitral Valve  There is moderate posterior annular calcification. There is mild to moderate regurgitation. There is no evidence of mitral valve stenosis.   Tricuspid Valve  The leaflets exhibit normal excursion. There is mild regurgitation. The right ventricular systolic pressure  is normal (04/2021 mmHg).   Aortic Valve  The leaflets are moderately thickened. Trace aortic valve regurgitation. There is no evidence of aortic valve stenosis.   Pulmonic Valve  Pulmonic valve with normal excursion. Trace regurgitation.   Ascending Aorta  The aortic root is normal in size.   Pericardium  There is a small pericardial effusion noted.   Study Details  A complete echo was performed using complete 2D, color flow Doppler and spectral Doppler. During the study the apical, parasternal and subcostal view was captured. Overall the study quality was adequate.       03/2022 Echo 1. Left ventricular ejection fraction, by estimation, is 65 to 70%. The  left ventricle has normal function. The left ventricle has no regional  wall motion abnormalities. Left ventricular diastolic parameters were  normal.   2. Right ventricular systolic function is normal. The right ventricular  size is normal. There is moderately elevated pulmonary artery systolic  pressure. The estimated right ventricular systolic pressure is 46.2 mmHg.   3. Left atrial size was moderately dilated.   4. A small pericardial effusion is present. The pericardial effusion is  posterior to the left ventricle.   5. The mitral valve is abnormal. Mild mitral valve regurgitation.  Moderate mitral annular calcification.   6. Tricuspid valve regurgitation is moderate.   7. The aortic valve is tricuspid. There is moderate calcification of the  aortic valve. Aortic valve regurgitation is mild. Aortic valve  sclerosis/calcification is present, without any evidence of aortic  stenosis.   8. The inferior vena cava is dilated in size with >50% respiratory  variability, suggesting right atrial pressure of 8 mmHg.   Comparison(s): No prior Echocardiogram.    Assessment and Plan  1.DOE - unclear etiology, progressing - echo with normal LVEF, did have elevated LA pressure and LAE. Moderate pulmonary HTN.  - seen by pulm  without clear etiology it appears - has had fairly significantly elevated bnp's on labs. Diuretic limited by renal dysfunction, most recently she reports pcp discontinued  - would consider RHC given progression DOE, elevated LA pressure and pulm HTN by echo, elevated BNPs with diuresis limited by renal dysfunction. Has labs pending with pcp, pending Cr could consider looking at coronaries as well given her DOE however on chart review had a spontaneous brain bleed on ASA in the past so poor DAPT candidate even if she has disease and thus will proceed with just RHC.  Would postpone back surgery until further cardiac workup is complete I have called and left a message with Dr Thomas's office about postponing the surgery.    2. History of intraventricular hemorrhage - admit 03/2021 [facility] - from notes nontraumatic bleed, was on ASA at the time  Nicole Nash, M.D. 

## 2022-08-06 ENCOUNTER — Telehealth: Payer: Self-pay | Admitting: Cardiology

## 2022-08-06 NOTE — Telephone Encounter (Signed)
Pt would like a callback regarding upcoming procedure. Pt states that she was told at appt yesterday by the provider that he would call and speak with the office performing pt's procedure to have it postponed. Pt says that she is confused because that office just called to confirm the procedure for Tuesday 08/11/22. Pleas advise

## 2022-08-07 DIAGNOSIS — Z7189 Other specified counseling: Secondary | ICD-10-CM | POA: Diagnosis not present

## 2022-08-07 DIAGNOSIS — E559 Vitamin D deficiency, unspecified: Secondary | ICD-10-CM | POA: Diagnosis not present

## 2022-08-07 DIAGNOSIS — Z299 Encounter for prophylactic measures, unspecified: Secondary | ICD-10-CM | POA: Diagnosis not present

## 2022-08-07 DIAGNOSIS — Z79899 Other long term (current) drug therapy: Secondary | ICD-10-CM | POA: Diagnosis not present

## 2022-08-07 DIAGNOSIS — Z0000 Encounter for general adult medical examination without abnormal findings: Secondary | ICD-10-CM | POA: Diagnosis not present

## 2022-08-07 DIAGNOSIS — E7800 Pure hypercholesterolemia, unspecified: Secondary | ICD-10-CM | POA: Diagnosis not present

## 2022-08-07 DIAGNOSIS — R5383 Other fatigue: Secondary | ICD-10-CM | POA: Diagnosis not present

## 2022-08-07 DIAGNOSIS — I10 Essential (primary) hypertension: Secondary | ICD-10-CM | POA: Diagnosis not present

## 2022-08-11 NOTE — Telephone Encounter (Signed)
Sent request for labs from pcp.

## 2022-08-11 NOTE — Telephone Encounter (Signed)
I spoke directly to her surgeon about postponing the procedure, I'm not sure what she got a call. I have requested her recent blood work from [facility] and will be in touch based on that about any possible additional tests   Dominga Ferry MD

## 2022-08-17 ENCOUNTER — Telehealth: Payer: Self-pay | Admitting: Cardiology

## 2022-08-17 DIAGNOSIS — Z23 Encounter for immunization: Secondary | ICD-10-CM | POA: Diagnosis not present

## 2022-08-17 NOTE — Telephone Encounter (Signed)
Follow Up:     Patient wants t o know if you received her lab results from 08-07-22?Marland Kitchen She had them at [facility]. [facility] is waiting to have abck surgery.

## 2022-08-17 NOTE — Telephone Encounter (Signed)
See labs scanned in - will fwd to provider for his review.

## 2022-08-18 NOTE — Telephone Encounter (Signed)
She agrees to doing the right heart cath.  Labs done on 08/07/2022. Negative for DM, CM allergy and not on anticoagulant.    Patient aware that I will be out of office tomorrow & will work on getting this scheduled when I return on Thursday.    She can go any day & prefers mid morning.

## 2022-08-18 NOTE — Telephone Encounter (Signed)
Just sent you a result note  Dominga Ferry MD

## 2022-08-18 NOTE — Telephone Encounter (Signed)
Lesle Chris, LPN  0/17/4944  5:17 PM EDT Back to Top    Notified.     Antoine Poche, MD  08/18/2022 12:53 PM EDT     Labs reviewed. Can we arrange a right heart catheterization to try to sort out the cause of her worsening SOB.   Dominga Ferry MD

## 2022-08-21 ENCOUNTER — Encounter: Payer: Self-pay | Admitting: *Deleted

## 2022-08-21 NOTE — Telephone Encounter (Signed)
Right heart cath scheduled for Thursday, 08/27/22 at noon with Dr. Swaziland.  She will need to arrive at 10:00 am to short stay at [facility].

## 2022-08-24 ENCOUNTER — Telehealth: Payer: Self-pay | Admitting: Cardiology

## 2022-08-24 ENCOUNTER — Other Ambulatory Visit: Payer: Self-pay | Admitting: Cardiology

## 2022-08-24 DIAGNOSIS — R0600 Dyspnea, unspecified: Secondary | ICD-10-CM

## 2022-08-24 MED ORDER — SODIUM CHLORIDE 0.9% FLUSH: 3.0000 mL | Freq: Two times a day (BID) | INTRAVENOUS | Status: AC

## 2022-08-24 NOTE — Telephone Encounter (Signed)
Right heart cath scheduled for Thursday, 08/27/22 at noon with Dr. Martinique.    CHECKING PERCERT

## 2022-08-25 ENCOUNTER — Telehealth: Payer: Self-pay | Admitting: *Deleted

## 2022-08-25 NOTE — Telephone Encounter (Signed)
Right Heart Cath scheduled at [facility] for: Thursday August 27, 2022 12 Noon Arrival time and place: [facility] Main Entrance A at: 10 AM  Nothing to eat after midnight prior to procedure, clear liquids until 5 AM day of procedure.  Medication instructions: -Usual morning medications can be taken with sips of water.  Confirmed patient has responsible adult to drive home post procedure and be with patient first 24 hours after arriving home.  Patient reports no new symptoms concerning for COVID-19 in the past 10 days.  Reviewed procedure instructions with patient.

## 2022-08-27 ENCOUNTER — Encounter (HOSPITAL_COMMUNITY)
Admission: RE | Disposition: A | Discharge status: Home / Self Care | Payer: Medicare Other | Source: Home / Self Care | Attending: Cardiology

## 2022-08-27 ENCOUNTER — Ambulatory Visit (HOSPITAL_COMMUNITY)
Admission: RE | Admit: 2022-08-27 | Discharge: 2022-08-27 | Disposition: A | Discharge status: Home / Self Care | Payer: Medicare Other | Attending: Cardiology | Admitting: Cardiology

## 2022-08-27 ENCOUNTER — Other Ambulatory Visit: Payer: Self-pay

## 2022-08-27 DIAGNOSIS — R0600 Dyspnea, unspecified: Secondary | ICD-10-CM | POA: Diagnosis not present

## 2022-08-27 DIAGNOSIS — I272 Pulmonary hypertension, unspecified: Secondary | ICD-10-CM | POA: Diagnosis not present

## 2022-08-27 DIAGNOSIS — Z8673 Personal history of transient ischemic attack (TIA), and cerebral infarction without residual deficits: Secondary | ICD-10-CM | POA: Insufficient documentation

## 2022-08-27 HISTORY — PX: RIGHT HEART CATH: CATH118263

## 2022-08-27 LAB — POCT I-STAT EG7
Acid-Base Excess: 1 mmol/L (ref 0.0–2.0)
Acid-Base Excess: 2 mmol/L (ref 0.0–2.0)
Bicarbonate: 27.7 mmol/L (ref 20.0–28.0)
Bicarbonate: 28.1 mmol/L — ABNORMAL HIGH (ref 20.0–28.0)
Calcium, Ion: 1.23 mmol/L (ref 1.15–1.40)
Calcium, Ion: 1.23 mmol/L (ref 1.15–1.40)
HCT: 34 % — ABNORMAL LOW (ref 36.0–46.0)
HCT: 34 % — ABNORMAL LOW (ref 36.0–46.0)
Hemoglobin: 11.6 g/dL — ABNORMAL LOW (ref 12.0–15.0)
Hemoglobin: 11.6 g/dL — ABNORMAL LOW (ref 12.0–15.0)
O2 Saturation: 62 %
O2 Saturation: 65 %
Potassium: 3.8 mmol/L (ref 3.5–5.1)
Potassium: 3.8 mmol/L (ref 3.5–5.1)
Sodium: 143 mmol/L (ref 135–145)
Sodium: 143 mmol/L (ref 135–145)
TCO2: 29 mmol/L (ref 22–32)
TCO2: 30 mmol/L (ref 22–32)
pCO2, Ven: 49.8 mmHg (ref 44–60)
pCO2, Ven: 50.8 mmHg (ref 44–60)
pH, Ven: 7.345 (ref 7.25–7.43)
pH, Ven: 7.359 (ref 7.25–7.43)
pO2, Ven: 34 mmHg (ref 32–45)
pO2, Ven: 36 mmHg (ref 32–45)

## 2022-08-27 SURGERY — RIGHT HEART CATH

## 2022-08-27 MED ORDER — SODIUM CHLORIDE 0.9% FLUSH: 3.0000 mL | INTRAVENOUS | Status: DC | PRN

## 2022-08-27 MED ORDER — ONDANSETRON HCL 4 MG/2ML IJ SOLN: 4.0000 mg | Freq: Four times a day (QID) | INTRAMUSCULAR | Status: DC | PRN

## 2022-08-27 MED ORDER — MIDAZOLAM HCL 2 MG/2ML IJ SOLN
INTRAMUSCULAR | Status: DC | PRN
  Administered 2022-08-27 (×2): 1 mg via INTRAVENOUS

## 2022-08-27 MED ORDER — LIDOCAINE IN D5W 4-5 MG/ML-% IV SOLN
INTRAVENOUS | Status: AC
  Filled 2022-08-27: qty 500

## 2022-08-27 MED ORDER — SODIUM CHLORIDE 0.9 % IV SOLN: 250.0000 mL | INTRAVENOUS | Status: DC | PRN

## 2022-08-27 MED ORDER — SODIUM CHLORIDE 0.9 % IV SOLN: INTRAVENOUS | Status: DC

## 2022-08-27 MED ORDER — MIDAZOLAM HCL 2 MG/2ML IJ SOLN
INTRAMUSCULAR | Status: AC
  Filled 2022-08-27: qty 2

## 2022-08-27 MED ORDER — HEPARIN (PORCINE) IN NACL 1000-0.9 UT/500ML-% IV SOLN
INTRAVENOUS | Status: AC
  Filled 2022-08-27: qty 500

## 2022-08-27 MED ORDER — ACETAMINOPHEN 325 MG PO TABS: 650.0000 mg | ORAL_TABLET | ORAL | Status: DC | PRN

## 2022-08-27 MED ORDER — HEPARIN (PORCINE) IN NACL 1000-0.9 UT/500ML-% IV SOLN
INTRAVENOUS | Status: DC | PRN
  Administered 2022-08-27: 500 mL

## 2022-08-27 MED ORDER — LIDOCAINE HCL (PF) 1 % IJ SOLN
INTRAMUSCULAR | Status: DC | PRN
  Administered 2022-08-27: 15 mL
  Administered 2022-08-27: 5 mL
  Administered 2022-08-27: 2 mL

## 2022-08-27 MED ORDER — SODIUM CHLORIDE 0.9% FLUSH: 3.0000 mL | Freq: Two times a day (BID) | INTRAVENOUS | Status: DC

## 2022-08-27 MED ORDER — FENTANYL CITRATE (PF) 100 MCG/2ML IJ SOLN
INTRAMUSCULAR | Status: DC | PRN
  Administered 2022-08-27: 25 ug via INTRAVENOUS

## 2022-08-27 MED ORDER — FENTANYL CITRATE (PF) 100 MCG/2ML IJ SOLN
INTRAMUSCULAR | Status: AC
  Filled 2022-08-27: qty 2

## 2022-08-27 SURGICAL SUPPLY — 7 items
CATH BALLN WEDGE 5F 110CM (CATHETERS) IMPLANT
CATH SWAN GANZ 7F STRAIGHT (CATHETERS) IMPLANT
GUIDEWIRE .025 260CM (WIRE) IMPLANT
PACK CARDIAC CATHETERIZATION (CUSTOM PROCEDURE TRAY) IMPLANT
SHEATH GLIDE SLENDER 4/5FR (SHEATH) IMPLANT
SHEATH PINNACLE 7F 10CM (SHEATH) IMPLANT
SHEATH PROBE COVER 6X72 (BAG) IMPLANT

## 2022-08-27 NOTE — Interval H&P Note (Signed)
History and Physical Interval Note:  08/27/2022 1:01 PM  Nicole Nash  has presented today for surgery, with the diagnosis of WORSENING SHORTNESS OF BREATH.  The various methods of treatment have been discussed with the patient and family. After consideration of risks, benefits and other options for treatment, the patient has consented to  Procedure(s): RIGHT HEART CATH (N/A) as a surgical intervention.  The patient's history has been reviewed, patient examined, no change in status, stable for surgery.  I have reviewed the patient's chart and labs.  Questions were answered to the patient's satisfaction.     Collier Salina [facility] 08/27/2022 1:01 PM

## 2022-08-27 NOTE — Progress Notes (Signed)
Patient ambulated to BR right groin remains CDI.

## 2022-08-27 NOTE — Progress Notes (Signed)
Site area: Right femoral venous sheath Site Prior to Removal:  Level 0 Pressure Applied: 10 minutes Manual: Yes Patient Status During Pull: Stable Post Sheath Removal Site:  Level 0 Post Sheath Removal Instructions Given:  yes Post Sheath Pulses Present: rt dp palpable Dressing Applied: gauze and tegaderm  Bedrest: Begins @ 1430 Comments:

## 2022-08-28 ENCOUNTER — Encounter (HOSPITAL_COMMUNITY): Payer: Self-pay | Admitting: Cardiology

## 2022-08-28 LAB — POCT I-STAT EG7
Acid-Base Excess: 2 mmol/L (ref 0.0–2.0)
Bicarbonate: 28.1 mmol/L — ABNORMAL HIGH (ref 20.0–28.0)
Calcium, Ion: 1.25 mmol/L (ref 1.15–1.40)
HCT: 35 % — ABNORMAL LOW (ref 36.0–46.0)
Hemoglobin: 11.9 g/dL — ABNORMAL LOW (ref 12.0–15.0)
O2 Saturation: 66 %
Potassium: 3.8 mmol/L (ref 3.5–5.1)
Sodium: 142 mmol/L (ref 135–145)
TCO2: 30 mmol/L (ref 22–32)
pCO2, Ven: 49.5 mmHg (ref 44–60)
pH, Ven: 7.362 (ref 7.25–7.43)
pO2, Ven: 36 mmHg (ref 32–45)

## 2022-08-31 ENCOUNTER — Telehealth: Payer: Self-pay | Admitting: Cardiology

## 2022-08-31 NOTE — Telephone Encounter (Signed)
Patient informed and verbalized understanding of plan. 

## 2022-08-31 NOTE — Telephone Encounter (Signed)
Can she come in Oct 4 220 pm in [place] to review the cath results and discuss surgery  Zandra Abts MD

## 2022-08-31 NOTE — Telephone Encounter (Signed)
Patient would like to know if there are any instructions she should be following after right heart cath. She would also like to know if it would be alright for her to start driving again. Please advise.

## 2022-08-31 NOTE — Telephone Encounter (Signed)
Patient states that she is looking to have back surgery and wants to know if she needs to come in sooner than her scheduled appointment in Nov with Finis Bud? States that she knows that her heart cath was done because of her breathing and feels that Nov is a long time to wait. Please advise

## 2022-09-04 DIAGNOSIS — I10 Essential (primary) hypertension: Secondary | ICD-10-CM | POA: Diagnosis not present

## 2022-09-09 ENCOUNTER — Encounter: Payer: Self-pay | Admitting: Cardiology

## 2022-09-09 ENCOUNTER — Ambulatory Visit: Payer: Medicare Other | Attending: Cardiology | Admitting: Cardiology

## 2022-09-09 VITALS — BP 142/64 | HR 61 | Ht 63.0 in | Wt 175.0 lb

## 2022-09-09 DIAGNOSIS — R0609 Other forms of dyspnea: Secondary | ICD-10-CM

## 2022-09-09 MED ORDER — FUROSEMIDE 20 MG PO TABS: 20.0000 mg | ORAL_TABLET | Freq: Every day | ORAL | 3 refills | Status: DC | PRN

## 2022-09-09 MED ORDER — METOPROLOL TARTRATE 50 MG PO TABS: ORAL_TABLET | ORAL | 0 refills | Status: AC

## 2022-09-09 NOTE — Progress Notes (Signed)
Clinical Summary Ms. Gerhart is a 76 y.o.female seen today for follow up of the following medical problems.    1.DOE - seen by pulmonary - 02/2022 PFTs: minimal obstruction and decreased diffusion, moderate restriction -03/2022 echo: LVEF 65-70%, no WMAs, normal diastolic function, normal RV, mod pulm HTN PASP 46, mod LAE, mild MR, mod TR, RA pressure 8 mmHg     - SOB started about 6 months ago - has had some leg swelling for long time - some wheezing at times - mild orthopnea  - has been on lasix 20mg  x 2 months, started by pcp. No improvement in symptoms. 01/2022 Cr was 1.62 - weight again after head bleed 03/2021. Also limited by chronic back pain, leg pain. Up 15 lbs since 03/2021 when admitted with brain bleed - no chest pains.        - breathing is worsening. DOE with just walking short distances.  - some recent leg swelling. Weights are stable by clinic numbers, home weight stable. - DOE less than 1 flight of stairs - she reports pcp stopped lasix due to renal dysfunction,   02/2022 PFTs: minimal obstruction, minimal diffusion defect, moderate restriction   03/2022 echo: LVEF 65-70%, no WMAs, normal diastolic fxn, normal RV with moderately elevated PASP, mod LAE, mild MR, mod TR - elevated E/e' consisteint with elevated LA pressure     08/2022 [facility]: mean PA 31 PCWP 15 CI 2.92 -mild pulm HTN, high normal to slightly elevated PCWP - mild pulm HTN would not account for her symptoms, high normal PCWP       2. History of intraventricular hemorrhage - admit 03/2021 [facility] - from notes nontraumatic bleed, spontaneous please while on aspirin       3. Preop evaluation - L1-L2 laminectomy pending with Dr Duffy Rhody Past Medical History:  Diagnosis Date   Depression    Hyperlipidemia    Hypertension      Allergies  Allergen Reactions   Codeine Anaphylaxis and Shortness Of Breath     Current Outpatient Medications  Medication Sig Dispense Refill    acetaminophen (TYLENOL) 650 MG CR tablet Take 650 mg by mouth every 8 (eight) hours as needed for pain.     amLODipine (NORVASC) 10 MG tablet Take 10 mg by mouth in the morning.     atorvastatin (LIPITOR) 40 MG tablet Take 40 mg by mouth daily with supper.     LORazepam (ATIVAN) 1 MG tablet Take 0.5 mg by mouth 2 (two) times daily.  2   losartan (COZAAR) 100 MG tablet Take 100 mg by mouth in the morning.     metoprolol succinate (TOPROL-XL) 25 MG 24 hr tablet Take 25 mg by mouth in the morning.     Multiple Vitamin (MULTIVITAMIN WITH MINERALS) TABS tablet Take 1 tablet by mouth daily. Centrum for Women 50+     Current Facility-Administered Medications  Medication Dose Route Frequency Provider Last Rate Last Admin   sodium chloride flush (NS) 0.9 % injection 3 mL  3 mL Intravenous Q12H Arnoldo Lenis, MD         Past Surgical History:  Procedure Laterality Date   APPENDECTOMY  1975   BACK SURGERY  2011   CATARACT EXTRACTION W/PHACO Right 08/28/2013   Procedure: CATARACT EXTRACTION PHACO AND INTRAOCULAR LENS PLACEMENT ([place]);  Surgeon: Tonny Nini Cavan, MD;  Location: [facility];  Service: Ophthalmology;  Laterality: Right;  CDE:  9.15   CATARACT EXTRACTION W/PHACO Left 09/07/2013  Procedure: CATARACT EXTRACTION PHACO AND INTRAOCULAR LENS PLACEMENT (IOC);  Surgeon: Gemma Payor, MD;  Location: [facility];  Service: Ophthalmology;  Laterality: Left;  CDE:8.47   NECK SURGERY  1966   RENAL ARTERY STENT  2000   RIGHT HEART CATH N/A 08/27/2022   Procedure: RIGHT HEART CATH;  Surgeon: Swaziland, Peter M, MD;  Location: [facility] INVASIVE CV LAB;  Service: Cardiovascular;  Laterality: N/A;   TONSILLECTOMY  1958   TUBAL LIGATION  1975   WRIST SURGERY Right 2005     Allergies  Allergen Reactions   Codeine Anaphylaxis and Shortness Of Breath      Family History  Problem Relation Age of Onset   Breast cancer Neg Hx      Social History Ms. Brashear reports that she quit smoking about 11 years ago. Her smoking  use included cigarettes. She has never used smokeless tobacco. Ms. Jenkin reports no history of alcohol use.   Review of Systems CONSTITUTIONAL: No weight loss, fever, chills, weakness or fatigue.  HEENT: Eyes: No visual loss, blurred vision, double vision or yellow sclerae.No hearing loss, sneezing, congestion, runny nose or sore throat.  SKIN: No rash or itching.  CARDIOVASCULAR: per hpi RESPIRATORY: per hpi GASTROINTESTINAL: No anorexia, nausea, vomiting or diarrhea. No abdominal pain or blood.  GENITOURINARY: No burning on urination, no polyuria NEUROLOGICAL: No headache, dizziness, syncope, paralysis, ataxia, numbness or tingling in the extremities. No change in bowel or bladder control.  MUSCULOSKELETAL: No muscle, back pain, joint pain or stiffness.  LYMPHATICS: No enlarged nodes. No history of splenectomy.  PSYCHIATRIC: No history of depression or anxiety.  ENDOCRINOLOGIC: No reports of sweating, cold or heat intolerance. No polyuria or polydipsia.  Marland Kitchen   Physical Examination Today's Vitals   09/09/22 1420  BP: (!) 142/64  Pulse: 61  SpO2: 96%  Weight: 175 lb (79.4 kg)  Height: 5\' 3"  (1.6 m)   Body mass index is 31 kg/m.  Gen: resting comfortably, no acute distress HEENT: no scleral icterus, pupils equal round and reactive, no palptable cervical adenopathy,  CV: RRR, 2/6 systolic murmur apex, no jvd Resp: Clear to auscultation bilaterally GI: abdomen is soft, non-tender, non-distended, normal bowel sounds, no hepatosplenomegaly MSK: extremities are warm, no edema.  Skin: warm, no rash Neuro:  no focal deficits Psych: appropriate affect   Diagnostic Studies 03/2021 echo [facility] Left Ventricle: EF: 55-60%.    Pericardium: There is a small pericardial effusion noted. Narrative   This result has an attachment that is not available.    Left Ventricle  Left ventricle size is normal. There is moderate hypertrophy. EF: 55-60%. Wall motion is normal. Doppler parameters  are indeterminate for diastolic function.   Right Ventricle  Right ventricle is normal. Systolic function is normal.   Left Atrium  Left atrium is normal in size.   Right Atrium  Normal sized right atrium.   IVC/SVC  The inferior vena cava demonstrates a diameter of <=2.1 cm and collapses >50%; therefore, the right atrial pressure is estimated at 3 mmHg.   Mitral Valve  There is moderate posterior annular calcification. There is mild to moderate regurgitation. There is no evidence of mitral valve stenosis.   Tricuspid Valve  The leaflets exhibit normal excursion. There is mild regurgitation. The right ventricular systolic pressure is normal (<36 mmHg).   Aortic Valve  The leaflets are moderately thickened. Trace aortic valve regurgitation. There is no evidence of aortic valve stenosis.   Pulmonic Valve  Pulmonic valve with normal excursion. Trace  regurgitation.   Ascending Aorta  The aortic root is normal in size.   Pericardium  There is a small pericardial effusion noted.   Study Details  A complete echo was performed using complete 2D, color flow Doppler and spectral Doppler. During the study the apical, parasternal and subcostal view was captured. Overall the study quality was adequate.       03/2022 Echo 1. Left ventricular ejection fraction, by estimation, is 65 to 70%. The  left ventricle has normal function. The left ventricle has no regional  wall motion abnormalities. Left ventricular diastolic parameters were  normal.   2. Right ventricular systolic function is normal. The right ventricular  size is normal. There is moderately elevated pulmonary artery systolic  pressure. The estimated right ventricular systolic pressure is 46.2 mmHg.   3. Left atrial size was moderately dilated.   4. A small pericardial effusion is present. The pericardial effusion is  posterior to the left ventricle.   5. The mitral valve is abnormal. Mild mitral valve regurgitation.   Moderate mitral annular calcification.   6. Tricuspid valve regurgitation is moderate.   7. The aortic valve is tricuspid. There is moderate calcification of the  aortic valve. Aortic valve regurgitation is mild. Aortic valve  sclerosis/calcification is present, without any evidence of aortic  stenosis.   8. The inferior vena cava is dilated in size with >50% respiratory  variability, suggesting right atrial pressure of 8 mmHg.   Comparison(s): No prior Echocardiogram.     08/2022 RHC  LV end diastolic pressure is normal.   Hemodynamic findings consistent with mild pulmonary hypertension.   Normal LV filling pressures with mean PCWP of 15 mm Hg Mild pulmonary HTN with mean PAP 31 mm Hg (44/26)  Cardiac output of 5.3 L/min with index 2.92.    Assessment and Plan  1.DOE - unclear etiology, progressing - echo with normal LVEF, did have elevated LA pressure and LAE. Moderate pulmonary HTN.  - seen by pulm without clear etiology it appears - RHC with just mild pulm HTN, high normal borderline PCWP. Would not explain the level of her symptoms. Did not obtain LHC as she cannot take ASA due to prior intracerebral bleed. Significnat AKI on lasix 20mg  daily previously, would have available just prn at this time.   - unclear etiology of her significant DOE, only thing we have not checked is coronaries. Will obtain coronary CTA. Pending results also needs clearance for back surgery.           2. History of intraventricular hemorrhage - admit 03/2021 [facility] - from notes nontraumatic bleed, was on ASA at the time - avoid antiplatelet agents      04/2021, M.D

## 2022-09-09 NOTE — Patient Instructions (Addendum)
   Your cardiac CT will be scheduled at one of the below locations:   [facility] [address] [place], [place] [address] [phone]  If scheduled at [facility], please arrive at the [facility] and Children's Entrance (Entrance C2) of [facility] 30 minutes prior to test start time. You can use the FREE valet parking offered at entrance C (encouraged to control the heart rate for the test)  Proceed to the [facility] Radiology Department (first floor) to check-in and test prep.  All radiology patients and guests should use entrance C2 at [facility], accessed from [facility], even though the hospital's physical address listed is [address]    Please follow these instructions carefully (unless otherwise directed):   On the Night Before the Test: Be sure to Drink plenty of water. Do not consume any caffeinated/decaffeinated beverages or chocolate 12 hours prior to your test. Do not take any antihistamines 12 hours prior to your test.  On the Day of the Test:   [place] OF TEST  TAKE LOPRESSOR 50 MG TWO HOURS BEFORE TEST  Drink plenty of water until 1 hour prior to the test. Do not eat any food 1 hour prior to test. You may take your regular medications prior to the test.  Take metoprolol (Lopressor) two hours prior to test. HOLD Furosemide/Hydrochlorothiazide morning of the test. FEMALES- please wear underwire-free bra if available, avoid dresses & tight clothing   *     After the Test: Drink plenty of water. After receiving IV contrast, you may experience a mild flushed feeling. This is normal. On occasion, you may experience a mild rash up to 24 hours after the test. This is not dangerous. If this occurs, you can take Benadryl 25 mg and increase your fluid intake. If you experience trouble breathing, this can be serious. If it is severe call 911 IMMEDIATELY. If it is mild, please call  our office. If you take any of these medications: Glipizide/Metformin, Avandament, Glucavance, please do not take 48 hours after completing test unless otherwise instructed.  We will call to schedule your test 2-4 weeks out understanding that some insurance companies will need an authorization prior to the service being performed.   For non-scheduling related questions, please contact the cardiac imaging nurse navigator should you have any questions/concerns: Marchia Bond, Cardiac Imaging Nurse Navigator Gordy Clement, Cardiac Imaging Nurse Navigator  Heart and Vascular Services Direct Office Dial: [phone]   For scheduling needs, including cancellations and rescheduling, please call [place], [phone].

## 2022-09-23 ENCOUNTER — Telehealth (HOSPITAL_COMMUNITY): Payer: Self-pay | Admitting: Emergency Medicine

## 2022-09-23 NOTE — Telephone Encounter (Signed)
Reaching out to patient to offer assistance regarding upcoming cardiac imaging study; pt verbalizes understanding of appt date/time, parking situation and where to check in, pre-test NPO status and medications ordered, and verified current allergies; name and call back number provided for further questions should they arise Lanna Labella RN Navigator Cardiac Imaging [facility] [phone] office [phone] cell 

## 2022-09-24 ENCOUNTER — Ambulatory Visit (HOSPITAL_BASED_OUTPATIENT_CLINIC_OR_DEPARTMENT_OTHER)
Admission: RE | Admit: 2022-09-24 | Discharge: 2022-09-24 | Disposition: A | Discharge status: Home / Self Care | Payer: Medicare Other | Source: Ambulatory Visit | Attending: Internal Medicine | Admitting: Internal Medicine

## 2022-09-24 ENCOUNTER — Other Ambulatory Visit: Payer: Self-pay | Admitting: Internal Medicine

## 2022-09-24 ENCOUNTER — Ambulatory Visit (HOSPITAL_COMMUNITY)
Admission: RE | Admit: 2022-09-24 | Discharge: 2022-09-24 | Disposition: A | Discharge status: Home / Self Care | Payer: Medicare Other | Source: Ambulatory Visit | Attending: Cardiology | Admitting: Cardiology

## 2022-09-24 ENCOUNTER — Ambulatory Visit (HOSPITAL_COMMUNITY)
Admission: RE | Admit: 2022-09-24 | Discharge: 2022-09-24 | Disposition: A | Discharge status: Home / Self Care | Payer: Medicare Other | Source: Ambulatory Visit | Attending: Internal Medicine | Admitting: Internal Medicine

## 2022-09-24 DIAGNOSIS — R931 Abnormal findings on diagnostic imaging of heart and coronary circulation: Secondary | ICD-10-CM | POA: Insufficient documentation

## 2022-09-24 DIAGNOSIS — I2510 Atherosclerotic heart disease of native coronary artery without angina pectoris: Secondary | ICD-10-CM | POA: Insufficient documentation

## 2022-09-24 DIAGNOSIS — R0609 Other forms of dyspnea: Secondary | ICD-10-CM | POA: Diagnosis not present

## 2022-09-24 MED ORDER — NITROGLYCERIN 0.4 MG SL SUBL
SUBLINGUAL_TABLET | SUBLINGUAL | Status: AC
  Filled 2022-09-24: qty 2

## 2022-09-24 MED ORDER — NITROGLYCERIN 0.4 MG SL SUBL
0.8000 mg | SUBLINGUAL_TABLET | Freq: Once | SUBLINGUAL | Status: AC
  Administered 2022-09-24: 0.8 mg via SUBLINGUAL

## 2022-09-24 MED ORDER — IOHEXOL 350 MG/ML SOLN
100.0000 mL | Freq: Once | INTRAVENOUS | Status: AC | PRN
  Administered 2022-09-24: 100 mL via INTRAVENOUS

## 2022-10-05 DIAGNOSIS — I10 Essential (primary) hypertension: Secondary | ICD-10-CM | POA: Diagnosis not present

## 2022-10-06 ENCOUNTER — Telehealth: Payer: Self-pay | Admitting: Cardiology

## 2022-10-06 NOTE — Telephone Encounter (Signed)
   Pre-operative Risk Assessment    Patient Name: Nicole Nash  DOB: 04-26-1946 MRN: [id number]      Request for Surgical Clearance    Procedure:   Lumbar Foraminotomy  Date of Surgery:  Clearance TBD                                 Surgeon:  Dr. Duffy Rhody Surgeon's Group or Practice Name:  [facility] Phone number:  [phone] Fax number:  [phone] attn [name]   Type of Clearance Requested:   - Medical  - Pharmacy:  Hold Any applicable      Type of Anesthesia:  General    Additional requests/questions:    SignedHipolito Bayley   10/06/2022, 4:45 PM

## 2022-10-07 NOTE — Telephone Encounter (Signed)
CT scan shows primarily moderate blockages, there is one blockage that more borderline but not significant enough that would limit her back surgery. From cardiac standpoint would be ok pursuing her surgery  Carlyle Dolly MD

## 2022-10-08 NOTE — Telephone Encounter (Signed)
   Patient Name: Nicole Nash  DOB: 1946/11/03 MRN: [id number]  Primary Cardiologist: Carlyle Dolly, MD  Chart reviewed as part of pre-operative protocol coverage. Given past medical history and time since last visit, based on ACC/AHA guidelines, Nicole Nash is at acceptable risk for the planned procedure without further cardiovascular testing.   Per Dr. Harl Bowie, primary cardiologist, "CT scan shows primarily moderate blockages, there is one blockage that more borderline but not significant enough that would limit her back surgery. From cardiac standpoint would be ok pursuing her surgery."  I will route this recommendation to the requesting party via [place] fax function and remove from pre-op pool.  Please call with questions.  Lenna Sciara, NP 10/08/2022, 9:10 AM

## 2022-10-12 ENCOUNTER — Other Ambulatory Visit: Payer: Self-pay | Admitting: Neurosurgery

## 2022-10-13 DIAGNOSIS — N1832 Chronic kidney disease, stage 3b: Secondary | ICD-10-CM | POA: Diagnosis not present

## 2022-10-13 DIAGNOSIS — M25511 Pain in right shoulder: Secondary | ICD-10-CM | POA: Diagnosis not present

## 2022-10-13 DIAGNOSIS — M549 Dorsalgia, unspecified: Secondary | ICD-10-CM | POA: Diagnosis not present

## 2022-10-13 DIAGNOSIS — I10 Essential (primary) hypertension: Secondary | ICD-10-CM | POA: Diagnosis not present

## 2022-10-13 DIAGNOSIS — Z299 Encounter for prophylactic measures, unspecified: Secondary | ICD-10-CM | POA: Diagnosis not present

## 2022-10-14 ENCOUNTER — Other Ambulatory Visit: Payer: Self-pay | Admitting: Neurosurgery

## 2022-10-26 ENCOUNTER — Encounter (HOSPITAL_COMMUNITY): Payer: Self-pay | Admitting: Physician Assistant

## 2022-10-26 ENCOUNTER — Encounter (HOSPITAL_COMMUNITY): Payer: Self-pay | Admitting: Vascular Surgery

## 2022-10-27 ENCOUNTER — Other Ambulatory Visit: Payer: Self-pay | Admitting: Neurosurgery

## 2022-10-27 NOTE — Pre-Procedure Instructions (Signed)
Surgical Instructions    Your procedure is scheduled on Thursday 11/05/22.   Report to [facility] Main Entrance "A" at 10:00 A.M., then check in with the Admitting office.  Call this number if you have problems the morning of surgery:  [phone]   If you have any questions prior to your surgery date call [phone]: Open Monday-Friday 8am-4pm If you experience any cold or flu symptoms such as cough, fever, chills, shortness of breath, etc. between now and your scheduled surgery, please notify us at the above number     Remember:  Do not eat or drink after midnight the night before your surgery   Take these medicines the morning of surgery with A SIP OF WATER:   amLODipine (NORVASC)   metoprolol succinate (TOPROL-XL)   Take these medicines if needed:  acetaminophen (TYLENOL)  LORazepam (ATIVAN)    As of today, STOP taking any Aspirin (unless otherwise instructed by your surgeon) Aleve, Naproxen, Ibuprofen, Motrin, Advil, Goody's, BC's, all herbal medications, fish oil, and all vitamins.           Do not wear jewelry or makeup. Do not wear lotions, powders, perfumes/cologne or deodorant. Do not shave 48 hours prior to surgery.  Men may shave face and neck. Do not bring valuables to the hospital. Do not wear nail polish, gel polish, artificial nails, or any other type of covering on natural nails (fingers and toes) If you have artificial nails or gel coating that need to be removed by a nail salon, please have this removed prior to surgery. Artificial nails or gel coating may interfere with anesthesia's ability to adequately monitor your vital signs.  [place] is not responsible for any belongings or valuables.    Do NOT Smoke (Tobacco/Vaping)  24 hours prior to your procedure  If you use a CPAP at night, you may bring your mask for your overnight stay.   Contacts, glasses, hearing aids, dentures or partials may not be worn into surgery, please bring cases for these  belongings   For patients admitted to the hospital, discharge time will be determined by your treatment team.   Patients discharged the day of surgery will not be allowed to drive home, and someone needs to stay with them for 24 hours.   SURGICAL WAITING ROOM VISITATION Patients having surgery or a procedure may have no more than 2 support people in the waiting area - these visitors may rotate.   Children under the age of 23 must have an adult with them who is not the patient. If the patient needs to stay at the hospital during part of their recovery, the visitor guidelines for inpatient rooms apply. Pre-op nurse will coordinate an appropriate time for 1 support person to accompany patient in pre-op.  This support person may not rotate.   Please refer to [email or web address] for the visitor guidelines for Inpatients (after your surgery is over and you are in a regular room).    Special instructions:    Oral Hygiene is also important to reduce your risk of infection.  Remember - BRUSH YOUR TEETH THE MORNING OF SURGERY WITH YOUR REGULAR TOOTHPASTE   [place]- Preparing For Surgery  Before surgery, you can play an important role. Because skin is not sterile, your skin needs to be as free of germs as possible. You can reduce the number of germs on your skin by washing with CHG (chlorahexidine gluconate) Soap before surgery.  CHG is an antiseptic cleaner which kills germs and  bonds with the skin to continue killing germs even after washing.     Please do not use if you have an allergy to CHG or antibacterial soaps. If your skin becomes reddened/irritated stop using the CHG.  Do not shave (including legs and underarms) for at least 48 hours prior to first CHG shower. It is OK to shave your face.  Please follow these instructions carefully.     Shower the NIGHT BEFORE SURGERY and the MORNING OF SURGERY with CHG Soap.   If you chose to wash  your hair, wash your hair first as usual with your normal shampoo. After you shampoo, rinse your hair and body thoroughly to remove the shampoo.  Then [employer] and genitals (private parts) with your normal soap and rinse thoroughly to remove soap.  After that Use CHG Soap as you would any other liquid soap. You can apply CHG directly to the skin and wash gently with a scrungie or a clean washcloth.   Apply the CHG Soap to your body ONLY FROM THE NECK DOWN.  Do not use on open wounds or open sores. Avoid contact with your eyes, ears, mouth and genitals (private parts). Wash Face and genitals (private parts)  with your normal soap.   Wash thoroughly, paying special attention to the area where your surgery will be performed.  Thoroughly rinse your body with warm water from the neck down.  DO NOT shower/wash with your normal soap after using and rinsing off the CHG Soap.  Pat yourself dry with a CLEAN TOWEL.  Wear CLEAN PAJAMAS to bed the night before surgery  Place CLEAN SHEETS on your bed the night before your surgery  DO NOT SLEEP WITH PETS.   Day of Surgery:  Take a shower with CHG soap. Wear Clean/Comfortable clothing the morning of surgery Do not apply any deodorants/lotions.   Remember to brush your teeth WITH YOUR REGULAR TOOTHPASTE.    If you received a COVID test during your pre-op visit, it is requested that you wear a mask when out in public, stay away from anyone that may not be feeling well, and notify your surgeon if you develop symptoms. If you have been in contact with anyone that has tested positive in the last 10 days, please notify your surgeon.    Please read over the following fact sheets that you were given.

## 2022-10-28 ENCOUNTER — Encounter (HOSPITAL_COMMUNITY)
Admission: RE | Admit: 2022-10-28 | Discharge: 2022-10-28 | Disposition: A | Discharge status: Home / Self Care | Payer: Medicare Other | Source: Ambulatory Visit | Attending: Anesthesiology | Admitting: Anesthesiology

## 2022-10-28 ENCOUNTER — Encounter (HOSPITAL_COMMUNITY): Payer: Self-pay

## 2022-10-28 VITALS — BP 140/57 | HR 76 | Temp 98.1°F | Resp 18 | Ht 63.0 in | Wt 172.7 lb

## 2022-10-28 DIAGNOSIS — Z01812 Encounter for preprocedural laboratory examination: Secondary | ICD-10-CM | POA: Diagnosis not present

## 2022-10-28 DIAGNOSIS — Z01818 Encounter for other preprocedural examination: Secondary | ICD-10-CM

## 2022-10-28 DIAGNOSIS — Z1152 Encounter for screening for COVID-19: Secondary | ICD-10-CM | POA: Diagnosis not present

## 2022-10-28 DIAGNOSIS — R059 Cough, unspecified: Secondary | ICD-10-CM | POA: Diagnosis not present

## 2022-10-28 HISTORY — DX: Unspecified osteoarthritis, unspecified site: M19.90

## 2022-10-28 HISTORY — DX: Dyspnea, unspecified: R06.00

## 2022-10-28 HISTORY — DX: Chronic obstructive pulmonary disease, unspecified: J44.9

## 2022-10-28 LAB — BASIC METABOLIC PANEL
Anion gap: 10 (ref 5–15)
BUN: 11 mg/dL (ref 8–23)
CO2: 26 mmol/L (ref 22–32)
Calcium: 9.3 mg/dL (ref 8.9–10.3)
Chloride: 104 mmol/L (ref 98–111)
Creatinine, Ser: 1.37 mg/dL — ABNORMAL HIGH (ref 0.44–1.00)
GFR, Estimated: 40 mL/min — ABNORMAL LOW (ref >60)
Glucose, Bld: 111 mg/dL — ABNORMAL HIGH (ref 70–99)
Potassium: 3.9 mmol/L (ref 3.5–5.1)
Sodium: 140 mmol/L (ref 135–145)

## 2022-10-28 LAB — CBC
HCT: 39.9 % (ref 36.0–46.0)
Hemoglobin: 13.5 g/dL (ref 12.0–15.0)
MCH: 32.1 pg (ref 26.0–34.0)
MCHC: 33.8 g/dL (ref 30.0–36.0)
MCV: 95 fL (ref 80.0–100.0)
Platelets: 195 10*3/uL (ref 150–400)
RBC: 4.2 MIL/uL (ref 3.87–5.11)
RDW: 12.5 % (ref 11.5–15.5)
WBC: 5.5 10*3/uL (ref 4.0–10.5)
nRBC: 0 % (ref 0.0–0.2)

## 2022-10-28 LAB — SARS CORONAVIRUS 2 (TAT 6-24 HRS): SARS Coronavirus 2: NEGATIVE

## 2022-10-28 LAB — SURGICAL PCR SCREEN
MRSA, PCR: NEGATIVE
Staphylococcus aureus: NEGATIVE

## 2022-10-28 NOTE — Progress Notes (Addendum)
PCP - Dr.Shah Ashish Cardiologist - Dr.Jonathan Branch  PPM/ICD - pt denies Device Orders - n/a Rep Notified - n/a  Chest x-ray - n/a EKG - 04/02/22 Stress Test - pt denies ECHO - 03/11/22 Cardiac Cath - 08/27/22  Sleep Study - pt denies CPAP - n/a  Patient denies Diabetes.  Last dose of GLP1 agonist-  pt denies GLP1 instructions: pt denies  Blood Thinner Instructions:pt denies Aspirin Instructions:pt denies  NPO after Midnight ERAS Protcol -NO PRE-SURGERY Ensure or G2- not ordered  COVID TEST- yes, verbal order from Antionette Poles, PA   Anesthesia review: yes,James Burns, PA in to see patient. Patient c/o "dry cough" started yesterday evening, no other symptoms. Pt denies fever, no new SOB.   Patient denies shortness of breath, fever, cough and chest pain at PAT appointment   All instructions explained to the patient, with a verbal understanding of the material. Patient agrees to go over the instructions while at home for a better understanding. Patient also instructed to self quarantine after being tested for COVID-19. The opportunity to ask questions was provided.

## 2022-10-30 DIAGNOSIS — D692 Other nonthrombocytopenic purpura: Secondary | ICD-10-CM | POA: Diagnosis not present

## 2022-10-30 DIAGNOSIS — Z299 Encounter for prophylactic measures, unspecified: Secondary | ICD-10-CM | POA: Diagnosis not present

## 2022-10-30 DIAGNOSIS — J069 Acute upper respiratory infection, unspecified: Secondary | ICD-10-CM | POA: Diagnosis not present

## 2022-11-02 NOTE — Progress Notes (Signed)
Anesthesia Chart Review:  Evaluated patient in preop testing appointment due to report of dry cough for the last day and a half.  She states that over the past days she has developed an irritation in the back of her throat causing a dry cough.  She denies any purulent sputum.  She denies any symptoms of shortness of breath or any other signs or symptoms of illness.  She denies any sick contacts.  She is a former smoker having quit in 2012 and COPD is listed in her history however she denies this.  She does not take any inhaled medications.  On exam lungs are clear to auscultation bilaterally, heart is regular rate and rhythm without murmurs rubs or gallops.  We discussed this will need to be monitored over the next week leading up to her surgery.  If her symptoms stay stable or improve anticipate she will be able proceed.  If they worsen she is advised to be evaluated by her PCP or urgent care she will likely need to have her surgery postponed.  She will also be COVID tested today due to her symptoms.  Of note, patient has recently undergone thorough cardiology evaluation for persistent DOE.  PFTs 02/2022 showed minimal obstruction, minimal diffusion defect, moderate restriction.  Echo 03/2022 showed EF 65 to 70%, normal wall motion, normal diastolic function, normal RV with moderately elevated PASP, moderate LAE, mild MR, moderate TR.  Right heart cath 08/2022 showed mild pulmonary hypertension which would not account for her symptoms.  Coronary CTA with FFR analysis 09/24/2022 showed borderline significant flow limitation of the proximal RCA stenosis.  Dr. Wyline Mood commented on this stating, CT scan shows primarily moderate blockages, there is one blockage that more borderline but not significant enough that would limit her back surgery. From cardiac standpoint would be ok pursuing her surgery. "  COVID test was negative.  I called and spoke with the patient on 11/02/2022 to follow-up.  She reports that her  symptoms progressed rapidly and she was seen by her PCP on 10/30/2022 and diagnosed with bronchitis and treated with Z-Pak which she is currently taking.  She endorses significant purulent sputum and persistent cough.  She also reports significant fatigue.  We discussed that her surgery will need to be postponed to allow for her to recover from this.  She would likely need 3 to 4 weeks for full recovery.  I advised her to be best for her to follow-up with her PCP in 2 to 3 weeks to ensure she is fully recovered and optimized for surgery.  Patient verbalized understanding.  I reached out to Dr. Maisie Fus of surgical scheduler to advise the patient will need to be postponed due to acute illness.   Zannie Cove [facility] Short Stay Center/Anesthesiology Phone [phone] 11/02/2022 2:01 PM

## 2022-11-04 DIAGNOSIS — I10 Essential (primary) hypertension: Secondary | ICD-10-CM | POA: Diagnosis not present

## 2022-11-05 ENCOUNTER — Ambulatory Visit: Payer: Medicare Other | Admitting: Nurse Practitioner

## 2022-11-05 ENCOUNTER — Ambulatory Visit (HOSPITAL_COMMUNITY): Admission: RE | Admit: 2022-11-05 | Payer: Medicare Other | Source: Home / Self Care

## 2022-11-05 DIAGNOSIS — R5383 Other fatigue: Secondary | ICD-10-CM | POA: Diagnosis not present

## 2022-11-05 DIAGNOSIS — J019 Acute sinusitis, unspecified: Secondary | ICD-10-CM | POA: Diagnosis not present

## 2022-11-05 DIAGNOSIS — Z299 Encounter for prophylactic measures, unspecified: Secondary | ICD-10-CM | POA: Diagnosis not present

## 2022-11-05 SURGERY — LUMBAR LAMINECTOMY/DECOMPRESSION MICRODISCECTOMY 2 LEVELS
Anesthesia: General

## 2022-11-25 DIAGNOSIS — Z299 Encounter for prophylactic measures, unspecified: Secondary | ICD-10-CM | POA: Diagnosis not present

## 2022-11-25 DIAGNOSIS — I10 Essential (primary) hypertension: Secondary | ICD-10-CM | POA: Diagnosis not present

## 2022-11-25 DIAGNOSIS — M549 Dorsalgia, unspecified: Secondary | ICD-10-CM | POA: Diagnosis not present

## 2022-11-25 DIAGNOSIS — Z713 Dietary counseling and surveillance: Secondary | ICD-10-CM | POA: Diagnosis not present

## 2022-12-04 DIAGNOSIS — H6691 Otitis media, unspecified, right ear: Secondary | ICD-10-CM | POA: Diagnosis not present

## 2022-12-04 DIAGNOSIS — Z713 Dietary counseling and surveillance: Secondary | ICD-10-CM | POA: Diagnosis not present

## 2022-12-04 DIAGNOSIS — I10 Essential (primary) hypertension: Secondary | ICD-10-CM | POA: Diagnosis not present

## 2022-12-04 DIAGNOSIS — Z299 Encounter for prophylactic measures, unspecified: Secondary | ICD-10-CM | POA: Diagnosis not present

## 2022-12-14 ENCOUNTER — Other Ambulatory Visit: Payer: Self-pay | Admitting: Neurosurgery

## 2022-12-16 DIAGNOSIS — R5383 Other fatigue: Secondary | ICD-10-CM | POA: Diagnosis not present

## 2022-12-16 DIAGNOSIS — J069 Acute upper respiratory infection, unspecified: Secondary | ICD-10-CM | POA: Diagnosis not present

## 2022-12-16 DIAGNOSIS — J449 Chronic obstructive pulmonary disease, unspecified: Secondary | ICD-10-CM | POA: Diagnosis not present

## 2022-12-16 DIAGNOSIS — R509 Fever, unspecified: Secondary | ICD-10-CM | POA: Diagnosis not present

## 2022-12-16 DIAGNOSIS — Z87891 Personal history of nicotine dependence: Secondary | ICD-10-CM | POA: Diagnosis not present

## 2022-12-16 DIAGNOSIS — Z299 Encounter for prophylactic measures, unspecified: Secondary | ICD-10-CM | POA: Diagnosis not present

## 2022-12-18 ENCOUNTER — Other Ambulatory Visit: Payer: Self-pay | Admitting: Neurosurgery

## 2022-12-21 DIAGNOSIS — Z299 Encounter for prophylactic measures, unspecified: Secondary | ICD-10-CM | POA: Diagnosis not present

## 2022-12-21 DIAGNOSIS — N1832 Chronic kidney disease, stage 3b: Secondary | ICD-10-CM | POA: Diagnosis not present

## 2022-12-21 DIAGNOSIS — J449 Chronic obstructive pulmonary disease, unspecified: Secondary | ICD-10-CM | POA: Diagnosis not present

## 2022-12-21 DIAGNOSIS — D692 Other nonthrombocytopenic purpura: Secondary | ICD-10-CM | POA: Diagnosis not present

## 2022-12-21 DIAGNOSIS — U071 COVID-19: Secondary | ICD-10-CM | POA: Diagnosis not present

## 2022-12-24 ENCOUNTER — Other Ambulatory Visit (HOSPITAL_COMMUNITY): Payer: Medicare Other

## 2022-12-28 DIAGNOSIS — I739 Peripheral vascular disease, unspecified: Secondary | ICD-10-CM | POA: Diagnosis not present

## 2022-12-28 DIAGNOSIS — J069 Acute upper respiratory infection, unspecified: Secondary | ICD-10-CM | POA: Diagnosis not present

## 2022-12-28 DIAGNOSIS — U071 COVID-19: Secondary | ICD-10-CM | POA: Diagnosis not present

## 2022-12-28 DIAGNOSIS — N183 Chronic kidney disease, stage 3 unspecified: Secondary | ICD-10-CM | POA: Diagnosis not present

## 2022-12-28 DIAGNOSIS — Z299 Encounter for prophylactic measures, unspecified: Secondary | ICD-10-CM | POA: Diagnosis not present

## 2022-12-29 ENCOUNTER — Ambulatory Visit: Admit: 2022-12-29 | Payer: Medicare Other

## 2022-12-29 SURGERY — LUMBAR LAMINECTOMY/DECOMPRESSION MICRODISCECTOMY 2 LEVELS
Anesthesia: General

## 2023-01-13 DIAGNOSIS — Z299 Encounter for prophylactic measures, unspecified: Secondary | ICD-10-CM | POA: Diagnosis not present

## 2023-01-13 DIAGNOSIS — I10 Essential (primary) hypertension: Secondary | ICD-10-CM | POA: Diagnosis not present

## 2023-01-13 DIAGNOSIS — L918 Other hypertrophic disorders of the skin: Secondary | ICD-10-CM | POA: Diagnosis not present

## 2023-01-15 DIAGNOSIS — I10 Essential (primary) hypertension: Secondary | ICD-10-CM | POA: Diagnosis not present

## 2023-01-15 DIAGNOSIS — D492 Neoplasm of unspecified behavior of bone, soft tissue, and skin: Secondary | ICD-10-CM | POA: Diagnosis not present

## 2023-01-15 DIAGNOSIS — M549 Dorsalgia, unspecified: Secondary | ICD-10-CM | POA: Diagnosis not present

## 2023-01-15 DIAGNOSIS — Z299 Encounter for prophylactic measures, unspecified: Secondary | ICD-10-CM | POA: Diagnosis not present

## 2023-01-16 DIAGNOSIS — S8392XA Sprain of unspecified site of left knee, initial encounter: Secondary | ICD-10-CM | POA: Diagnosis not present

## 2023-01-27 DIAGNOSIS — M25562 Pain in left knee: Secondary | ICD-10-CM | POA: Diagnosis not present

## 2023-01-27 DIAGNOSIS — Z299 Encounter for prophylactic measures, unspecified: Secondary | ICD-10-CM | POA: Diagnosis not present

## 2023-01-27 DIAGNOSIS — J302 Other seasonal allergic rhinitis: Secondary | ICD-10-CM | POA: Diagnosis not present

## 2023-01-27 DIAGNOSIS — W19XXXA Unspecified fall, initial encounter: Secondary | ICD-10-CM | POA: Diagnosis not present

## 2023-01-27 DIAGNOSIS — I10 Essential (primary) hypertension: Secondary | ICD-10-CM | POA: Diagnosis not present

## 2023-02-03 DIAGNOSIS — I10 Essential (primary) hypertension: Secondary | ICD-10-CM | POA: Diagnosis not present

## 2023-02-05 ENCOUNTER — Other Ambulatory Visit: Payer: Self-pay | Admitting: Neurosurgery

## 2023-02-08 DIAGNOSIS — I10 Essential (primary) hypertension: Secondary | ICD-10-CM | POA: Diagnosis not present

## 2023-02-08 DIAGNOSIS — Z299 Encounter for prophylactic measures, unspecified: Secondary | ICD-10-CM | POA: Diagnosis not present

## 2023-02-08 DIAGNOSIS — H6691 Otitis media, unspecified, right ear: Secondary | ICD-10-CM | POA: Diagnosis not present

## 2023-02-08 DIAGNOSIS — M25562 Pain in left knee: Secondary | ICD-10-CM | POA: Diagnosis not present

## 2023-02-09 NOTE — Progress Notes (Signed)
Surgical Instructions    Your procedure is scheduled on Wednesday March 13th.  Report to [facility] Main Entrance "A" at 11:15 A.M., then check in with the Admitting office.  Call this number if you have problems the morning of surgery:  [phone]   If you have any questions prior to your surgery date call [phone]: Open Monday-Friday 8am-4pm If you experience any cold or flu symptoms such as cough, fever, chills, shortness of breath, etc. between now and your scheduled surgery, please notify us at the above number     Remember:  Do not eat after midnight the night before your surgery  You may drink clear liquids until 10:15am the morning of your surgery.   Clear liquids allowed are: Water, Non-Citrus Juices (without pulp), Carbonated Beverages, Clear Tea, Black Coffee ONLY (NO MILK, CREAM OR POWDERED CREAMER of any kind), and Gatorade    Take these medicines the morning of surgery with A SIP OF WATER: amLODipine (NORVASC) 10 MG tablet  LORazepam (ATIVAN) 1 MG tablet  metoprolol succinate (TOPROL-XL) 25 MG 24 hr tablet   IF NEEDED  acetaminophen (TYLENOL) 650 MG CR tablet  omeprazole (PRILOSEC) 20 MG capsule    As of today, STOP taking any Aspirin (unless otherwise instructed by your surgeon) Aleve, Naproxen, Ibuprofen, Motrin, Advil, Goody's, BC's, all herbal medications, fish oil, and all vitamins.           Do not wear jewelry or makeup. Do not wear lotions, powders, perfumes or deodorant. Do not shave 48 hours prior to surgery.   Do not bring valuables to the hospital. Do not wear nail polish, gel polish, artificial nails, or any other type of covering on natural nails (fingers and toes) If you have artificial nails or gel coating that need to be removed by a nail salon, please have this removed prior to surgery. Artificial nails or gel coating may interfere with anesthesia's ability to adequately monitor your vital signs.  [place] is not responsible for any  belongings or valuables.    Do NOT Smoke (Tobacco/Vaping)  24 hours prior to your procedure  If you use a CPAP at night, you may bring your mask for your overnight stay.   Contacts, glasses, hearing aids, dentures or partials may not be worn into surgery, please bring cases for these belongings   For patients admitted to the hospital, discharge time will be determined by your treatment team.   Patients discharged the day of surgery will not be allowed to drive home, and someone needs to stay with them for 24 hours.   SURGICAL WAITING ROOM VISITATION Patients having surgery or a procedure may have no more than 2 support people in the waiting area - these visitors may rotate.   Children under the age of 72 must have an adult with them who is not the patient. If the patient needs to stay at the hospital during part of their recovery, the visitor guidelines for inpatient rooms apply. Pre-op nurse will coordinate an appropriate time for 1 support person to accompany patient in pre-op.  This support person may not rotate.   Please refer to [email or web address] for the visitor guidelines for Inpatients (after your surgery is over and you are in a regular room).    Special instructions:    Oral Hygiene is also important to reduce your risk of infection.  Remember - BRUSH YOUR TEETH THE MORNING OF SURGERY WITH YOUR REGULAR TOOTHPASTE   [place]- Preparing For Surgery  Before surgery,  you can play an important role. Because skin is not sterile, your skin needs to be as free of germs as possible. You can reduce the number of germs on your skin by washing with CHG (chlorahexidine gluconate) Soap before surgery.  CHG is an antiseptic cleaner which kills germs and bonds with the skin to continue killing germs even after washing.     Please do not use if you have an allergy to CHG or antibacterial soaps. If your skin becomes reddened/irritated stop  using the CHG.  Do not shave (including legs and underarms) for at least 48 hours prior to first CHG shower. It is OK to shave your face.  Please follow these instructions carefully.     Shower the NIGHT BEFORE SURGERY and the MORNING OF SURGERY with CHG Soap.   If you chose to wash your hair, wash your hair first as usual with your normal shampoo. After you shampoo, rinse your hair and body thoroughly to remove the shampoo.  Then [employer] and genitals (private parts) with your normal soap and rinse thoroughly to remove soap.  After that Use CHG Soap as you would any other liquid soap. You can apply CHG directly to the skin and wash gently with a scrungie or a clean washcloth.   Apply the CHG Soap to your body ONLY FROM THE NECK DOWN.  Do not use on open wounds or open sores. Avoid contact with your eyes, ears, mouth and genitals (private parts). Wash Face and genitals (private parts)  with your normal soap.   Wash thoroughly, paying special attention to the area where your surgery will be performed.  Thoroughly rinse your body with warm water from the neck down.  DO NOT shower/wash with your normal soap after using and rinsing off the CHG Soap.  Pat yourself dry with a CLEAN TOWEL.  Wear CLEAN PAJAMAS to bed the night before surgery  Place CLEAN SHEETS on your bed the night before your surgery  DO NOT SLEEP WITH PETS.   Day of Surgery:  Take a shower with CHG soap. Wear Clean/Comfortable clothing the morning of surgery Do not apply any deodorants/lotions.   Remember to brush your teeth WITH YOUR REGULAR TOOTHPASTE.    If you received a COVID test during your pre-op visit, it is requested that you wear a mask when out in public, stay away from anyone that may not be feeling well, and notify your surgeon if you develop symptoms. If you have been in contact with anyone that has tested positive in the last 10 days, please notify your surgeon.    Please read over the following  fact sheets that you were given.

## 2023-02-10 ENCOUNTER — Encounter (HOSPITAL_COMMUNITY)
Admission: RE | Admit: 2023-02-10 | Discharge: 2023-02-10 | Disposition: A | Discharge status: Home / Self Care | Payer: Medicare Other | Source: Ambulatory Visit | Attending: Neurosurgery | Admitting: Neurosurgery

## 2023-02-10 ENCOUNTER — Other Ambulatory Visit: Payer: Self-pay

## 2023-02-10 ENCOUNTER — Encounter (HOSPITAL_COMMUNITY): Payer: Self-pay

## 2023-02-10 VITALS — BP 147/76 | HR 78 | Temp 97.8°F | Resp 17 | Ht 63.0 in | Wt 177.1 lb

## 2023-02-10 DIAGNOSIS — Z01812 Encounter for preprocedural laboratory examination: Secondary | ICD-10-CM | POA: Insufficient documentation

## 2023-02-10 DIAGNOSIS — Z01818 Encounter for other preprocedural examination: Secondary | ICD-10-CM

## 2023-02-10 DIAGNOSIS — I10 Essential (primary) hypertension: Secondary | ICD-10-CM

## 2023-02-10 LAB — CBC
HCT: 40.5 % (ref 36.0–46.0)
Hemoglobin: 13 g/dL (ref 12.0–15.0)
MCH: 31.3 pg (ref 26.0–34.0)
MCHC: 32.1 g/dL (ref 30.0–36.0)
MCV: 97.6 fL (ref 80.0–100.0)
Platelets: 196 10*3/uL (ref 150–400)
RBC: 4.15 MIL/uL (ref 3.87–5.11)
RDW: 12.9 % (ref 11.5–15.5)
WBC: 6.5 10*3/uL (ref 4.0–10.5)
nRBC: 0 % (ref 0.0–0.2)

## 2023-02-10 LAB — SURGICAL PCR SCREEN

## 2023-02-10 LAB — BASIC METABOLIC PANEL
Anion gap: 9 (ref 5–15)
BUN: 10 mg/dL (ref 8–23)
CO2: 28 mmol/L (ref 22–32)
Calcium: 9.3 mg/dL (ref 8.9–10.3)
Chloride: 107 mmol/L (ref 98–111)
Creatinine, Ser: 1.2 mg/dL — ABNORMAL HIGH (ref 0.44–1.00)
GFR, Estimated: 47 mL/min — ABNORMAL LOW (ref >60)
Glucose, Bld: 104 mg/dL — ABNORMAL HIGH (ref 70–99)
Potassium: 4.8 mmol/L (ref 3.5–5.1)
Sodium: 144 mmol/L (ref 135–145)

## 2023-02-10 NOTE — Progress Notes (Signed)
PCP - Dr. Monico Blitz Cardiologist - Dr. Carlyle Dolly  PPM/ICD - denies   Chest x-ray - 11/01/20 EKG - 04/02/22 Stress Test - 2023, [place] per pt, no issues per pt ECHO - 03/11/22 Cardiac Cath - 08/27/22  Sleep Study - denies   DM- denies  ASA/Blood Thinner Instructions: n/a   ERAS Protcol - yes, no drink   COVID TEST- n/a   Anesthesia review: yes, cardiac hx  Patient denies shortness of breath, fever, cough and chest pain at PAT appointment   All instructions explained to the patient, with a verbal understanding of the material. Patient agrees to go over the instructions while at home for a better understanding.  The opportunity to ask questions was provided.

## 2023-02-11 NOTE — Anesthesia Preprocedure Evaluation (Addendum)
Anesthesia Evaluation  Patient identified by MRN, date of birth, ID band Patient awake    Reviewed: Allergy & Precautions, NPO status , Patient's Chart, lab work & pertinent test results, reviewed documented beta blocker date and time   History of Anesthesia Complications Negative for: history of anesthetic complications  Airway Mallampati: II  TM Distance: >3 FB Neck ROM: Full    Dental  (+) Dental Advisory Given, Upper Dentures   Pulmonary COPD, former smoker   Pulmonary exam normal        Cardiovascular hypertension (amlodipine, metoprolol, losartan), Pt. on medications and Pt. on home beta blockers pulmonary hypertension (mild)Normal cardiovascular exam(-) dysrhythmias (PVCs) + Valvular Problems/Murmurs (mild MR, moderate TR)    HLD  Coronary CT 09/14/2022: IMPRESSION: 1. CT FFR analysis demonstrated borderline significant flow limitation at the proximal RCA stenosis.  2. Clinical correlation is advised and additional evaluation may be warranted.  [place] 08/27/2022:   LV end diastolic pressure is normal.   Hemodynamic findings consistent with mild pulmonary hypertension.   TTE 03/11/2022: EF is 65 to 70%. There is moderately elevated pulmonary artery systolic pressure. The estimated right ventricular systolic pressure is 38.2 mmHg. Left atrial size was moderately dilated. A small pericardial effusion is present. The pericardial effusion is  posterior to the left ventricle. Mild mitral valve regurgitation. Tricuspid valve regurgitation is moderate. Aortic valve regurgitation is mild.     Neuro/Psych  PSYCHIATRIC DISORDERS  Depression    negative neurological ROS     GI/Hepatic Neg liver ROS,GERD  Medicated and Controlled,,  Endo/Other  negative endocrine ROS    Renal/GU negative Renal ROS     Musculoskeletal  (+) Arthritis ,    Abdominal   Peds  Hematology negative hematology ROS (+)   Anesthesia Other  Findings   Reproductive/Obstetrics                             Anesthesia Physical Anesthesia Plan  ASA: 3  Anesthesia Plan: General   Post-op Pain Management: Tylenol PO (pre-op)*   Induction: Intravenous  PONV Risk Score and Plan: 3 and Treatment may vary due to age or medical condition, Ondansetron and Propofol infusion  Airway Management Planned: Oral ETT  Additional Equipment: None  Intra-op Plan:   Post-operative Plan: Extubation in OR  Informed Consent: I have reviewed the patients History and Physical, chart, labs and discussed the procedure including the risks, benefits and alternatives for the proposed anesthesia with the patient or authorized representative who has indicated his/her understanding and acceptance.     Dental advisory given  Plan Discussed with: CRNA and Anesthesiologist  Anesthesia Plan Comments: (See recent PAT note by Karoline Caldwell, PA-C 11/02/22 )        Anesthesia Quick Evaluation

## 2023-02-11 NOTE — Progress Notes (Signed)
Surgical PCR from PAT was invalid. Will need repeated on DOS.

## 2023-02-17 ENCOUNTER — Ambulatory Visit (HOSPITAL_COMMUNITY): Payer: Medicare Other

## 2023-02-17 ENCOUNTER — Other Ambulatory Visit: Payer: Self-pay

## 2023-02-17 ENCOUNTER — Ambulatory Visit (HOSPITAL_COMMUNITY)
Admission: RE | Admit: 2023-02-17 | Discharge: 2023-02-17 | Disposition: A | Discharge status: Home / Self Care | Payer: Medicare Other | Attending: Neurosurgery | Admitting: Neurosurgery

## 2023-02-17 ENCOUNTER — Ambulatory Visit (HOSPITAL_BASED_OUTPATIENT_CLINIC_OR_DEPARTMENT_OTHER): Payer: Medicare Other | Admitting: Certified Registered Nurse Anesthetist

## 2023-02-17 ENCOUNTER — Encounter (HOSPITAL_COMMUNITY)
Admission: RE | Disposition: A | Discharge status: Home / Self Care | Payer: Self-pay | Source: Home / Self Care | Attending: Neurosurgery

## 2023-02-17 ENCOUNTER — Ambulatory Visit (HOSPITAL_COMMUNITY): Payer: Medicare Other | Admitting: Vascular Surgery

## 2023-02-17 ENCOUNTER — Encounter (HOSPITAL_COMMUNITY): Payer: Self-pay

## 2023-02-17 DIAGNOSIS — M1990 Unspecified osteoarthritis, unspecified site: Secondary | ICD-10-CM | POA: Diagnosis not present

## 2023-02-17 DIAGNOSIS — M48062 Spinal stenosis, lumbar region with neurogenic claudication: Secondary | ICD-10-CM

## 2023-02-17 DIAGNOSIS — Z87891 Personal history of nicotine dependence: Secondary | ICD-10-CM | POA: Insufficient documentation

## 2023-02-17 DIAGNOSIS — I272 Pulmonary hypertension, unspecified: Secondary | ICD-10-CM | POA: Insufficient documentation

## 2023-02-17 DIAGNOSIS — J449 Chronic obstructive pulmonary disease, unspecified: Secondary | ICD-10-CM | POA: Diagnosis not present

## 2023-02-17 DIAGNOSIS — E785 Hyperlipidemia, unspecified: Secondary | ICD-10-CM | POA: Diagnosis not present

## 2023-02-17 DIAGNOSIS — I10 Essential (primary) hypertension: Secondary | ICD-10-CM

## 2023-02-17 DIAGNOSIS — Z79899 Other long term (current) drug therapy: Secondary | ICD-10-CM | POA: Insufficient documentation

## 2023-02-17 DIAGNOSIS — K219 Gastro-esophageal reflux disease without esophagitis: Secondary | ICD-10-CM | POA: Diagnosis not present

## 2023-02-17 DIAGNOSIS — Z01818 Encounter for other preprocedural examination: Secondary | ICD-10-CM

## 2023-02-17 DIAGNOSIS — M48061 Spinal stenosis, lumbar region without neurogenic claudication: Secondary | ICD-10-CM | POA: Diagnosis not present

## 2023-02-17 HISTORY — PX: LUMBAR LAMINECTOMY/DECOMPRESSION MICRODISCECTOMY: SHX5026

## 2023-02-17 LAB — SURGICAL PCR SCREEN
MRSA, PCR: NEGATIVE
Staphylococcus aureus: NEGATIVE

## 2023-02-17 SURGERY — LUMBAR LAMINECTOMY/DECOMPRESSION MICRODISCECTOMY 2 LEVELS
Anesthesia: General

## 2023-02-17 MED ORDER — LIDOCAINE-EPINEPHRINE 1 %-1:100000 IJ SOLN
INTRAMUSCULAR | Status: AC
  Filled 2023-02-17: qty 1

## 2023-02-17 MED ORDER — 0.9 % SODIUM CHLORIDE (POUR BTL) OPTIME
TOPICAL | Status: DC | PRN
  Administered 2023-02-17: 1000 mL

## 2023-02-17 MED ORDER — CEFAZOLIN SODIUM-DEXTROSE 2-3 GM-%(50ML) IV SOLR
INTRAVENOUS | Status: DC | PRN
  Administered 2023-02-17: 2 g via INTRAVENOUS

## 2023-02-17 MED ORDER — LACTATED RINGERS IV SOLN: INTRAVENOUS | Status: DC | PRN

## 2023-02-17 MED ORDER — CHLORHEXIDINE GLUCONATE 0.12 % MT SOLN
15.0000 mL | Freq: Once | OROMUCOSAL | Status: AC
  Administered 2023-02-17: 15 mL via OROMUCOSAL
  Filled 2023-02-17: qty 15

## 2023-02-17 MED ORDER — ACETAMINOPHEN 500 MG PO TABS
1000.0000 mg | ORAL_TABLET | Freq: Once | ORAL | Status: AC
  Administered 2023-02-17: 1000 mg via ORAL
  Filled 2023-02-17: qty 2

## 2023-02-17 MED ORDER — METHYLPREDNISOLONE ACETATE 80 MG/ML IJ SUSP
INTRAMUSCULAR | Status: DC | PRN
  Administered 2023-02-17: 80 mg

## 2023-02-17 MED ORDER — PHENYLEPHRINE HCL-NACL 20-0.9 MG/250ML-% IV SOLN
INTRAVENOUS | Status: DC | PRN
  Administered 2023-02-17: 30 ug/min via INTRAVENOUS

## 2023-02-17 MED ORDER — PROPOFOL 10 MG/ML IV BOLUS
INTRAVENOUS | Status: AC
  Filled 2023-02-17: qty 20

## 2023-02-17 MED ORDER — LIDOCAINE 2% (20 MG/ML) 5 ML SYRINGE
INTRAMUSCULAR | Status: DC | PRN
  Administered 2023-02-17: 60 mg via INTRAVENOUS

## 2023-02-17 MED ORDER — THROMBIN 5000 UNITS EX SOLR
CUTANEOUS | Status: AC
  Filled 2023-02-17: qty 5000

## 2023-02-17 MED ORDER — THROMBIN 5000 UNITS EX SOLR: OROMUCOSAL | Status: DC | PRN

## 2023-02-17 MED ORDER — BUPIVACAINE HCL (PF) 0.5 % IJ SOLN
INTRAMUSCULAR | Status: AC
  Filled 2023-02-17: qty 30

## 2023-02-17 MED ORDER — DEXAMETHASONE SODIUM PHOSPHATE 10 MG/ML IJ SOLN
INTRAMUSCULAR | Status: DC | PRN
  Administered 2023-02-17: 10 mg via INTRAVENOUS

## 2023-02-17 MED ORDER — LIDOCAINE-EPINEPHRINE 1 %-1:100000 IJ SOLN
INTRAMUSCULAR | Status: DC | PRN
  Administered 2023-02-17: 7 mL

## 2023-02-17 MED ORDER — LABETALOL HCL 5 MG/ML IV SOLN
INTRAVENOUS | Status: DC | PRN
  Administered 2023-02-17: 5 mg via INTRAVENOUS

## 2023-02-17 MED ORDER — LIDOCAINE 2% (20 MG/ML) 5 ML SYRINGE
INTRAMUSCULAR | Status: AC
  Filled 2023-02-17: qty 5

## 2023-02-17 MED ORDER — PROPOFOL 10 MG/ML IV BOLUS
INTRAVENOUS | Status: DC | PRN
  Administered 2023-02-17: 120 mg via INTRAVENOUS

## 2023-02-17 MED ORDER — FENTANYL CITRATE (PF) 250 MCG/5ML IJ SOLN
INTRAMUSCULAR | Status: DC | PRN
  Administered 2023-02-17 (×3): 50 ug via INTRAVENOUS

## 2023-02-17 MED ORDER — OXYCODONE-ACETAMINOPHEN 5-325 MG PO TABS: 1.0000 | ORAL_TABLET | Freq: Four times a day (QID) | ORAL | 0 refills | Status: AC | PRN

## 2023-02-17 MED ORDER — OXYCODONE HCL 5 MG PO TABS: 5.0000 mg | ORAL_TABLET | Freq: Once | ORAL | Status: AC | PRN

## 2023-02-17 MED ORDER — ROCURONIUM BROMIDE 10 MG/ML (PF) SYRINGE
PREFILLED_SYRINGE | INTRAVENOUS | Status: AC
  Filled 2023-02-17: qty 10

## 2023-02-17 MED ORDER — FENTANYL CITRATE (PF) 250 MCG/5ML IJ SOLN
INTRAMUSCULAR | Status: AC
  Filled 2023-02-17: qty 5

## 2023-02-17 MED ORDER — OXYCODONE HCL 5 MG PO TABS
ORAL_TABLET | ORAL | Status: AC
  Administered 2023-02-17: 5 mg via ORAL
  Filled 2023-02-17: qty 1

## 2023-02-17 MED ORDER — METHYLPREDNISOLONE ACETATE 80 MG/ML IJ SUSP
INTRAMUSCULAR | Status: AC
  Filled 2023-02-17: qty 1

## 2023-02-17 MED ORDER — SUGAMMADEX SODIUM 200 MG/2ML IV SOLN
INTRAVENOUS | Status: DC | PRN
  Administered 2023-02-17: 400 mg via INTRAVENOUS

## 2023-02-17 MED ORDER — PHENYLEPHRINE 80 MCG/ML (10ML) SYRINGE FOR IV PUSH (FOR BLOOD PRESSURE SUPPORT)
PREFILLED_SYRINGE | INTRAVENOUS | Status: AC
  Filled 2023-02-17: qty 10

## 2023-02-17 MED ORDER — AMISULPRIDE (ANTIEMETIC) 5 MG/2ML IV SOLN: 10.0000 mg | Freq: Once | INTRAVENOUS | Status: DC | PRN

## 2023-02-17 MED ORDER — ROCURONIUM BROMIDE 10 MG/ML (PF) SYRINGE
PREFILLED_SYRINGE | INTRAVENOUS | Status: DC | PRN
  Administered 2023-02-17: 60 mg via INTRAVENOUS

## 2023-02-17 MED ORDER — OXYCODONE HCL 5 MG/5ML PO SOLN: 5.0000 mg | Freq: Once | ORAL | Status: AC | PRN

## 2023-02-17 MED ORDER — ORAL CARE MOUTH RINSE: 15.0000 mL | Freq: Once | OROMUCOSAL | Status: AC

## 2023-02-17 MED ORDER — LACTATED RINGERS IV SOLN: INTRAVENOUS | Status: DC

## 2023-02-17 MED ORDER — ONDANSETRON HCL 4 MG/2ML IJ SOLN
INTRAMUSCULAR | Status: DC | PRN
  Administered 2023-02-17: 4 mg via INTRAVENOUS

## 2023-02-17 MED ORDER — EPHEDRINE SULFATE-NACL 50-0.9 MG/10ML-% IV SOSY
PREFILLED_SYRINGE | INTRAVENOUS | Status: DC | PRN
  Administered 2023-02-17 (×2): 10 mg via INTRAVENOUS

## 2023-02-17 MED ORDER — BUPIVACAINE HCL (PF) 0.5 % IJ SOLN
INTRAMUSCULAR | Status: DC | PRN
  Administered 2023-02-17: 8 mL

## 2023-02-17 MED ORDER — FENTANYL CITRATE (PF) 100 MCG/2ML IJ SOLN: 25.0000 ug | INTRAMUSCULAR | Status: DC | PRN

## 2023-02-17 MED ORDER — EPHEDRINE 5 MG/ML INJ
INTRAVENOUS | Status: AC
  Filled 2023-02-17: qty 5

## 2023-02-17 MED ORDER — CYCLOBENZAPRINE HCL 10 MG PO TABS: 10.0000 mg | ORAL_TABLET | Freq: Three times a day (TID) | ORAL | 2 refills | Status: AC | PRN

## 2023-02-17 SURGICAL SUPPLY — 56 items
ADH SKN CLS APL DERMABOND .7 (GAUZE/BANDAGES/DRESSINGS) ×1
APL SKNCLS STERI-STRIP NONHPOA (GAUZE/BANDAGES/DRESSINGS) ×1
BAG COUNTER SPONGE SURGICOUNT (BAG) ×1 IMPLANT
BAG SPNG CNTER NS LX DISP (BAG) ×1
BAND INSRT 18 STRL LF DISP RB (MISCELLANEOUS) ×2
BAND RUBBER #18 3X1/16 STRL (MISCELLANEOUS) ×2 IMPLANT
BENZOIN TINCTURE PRP APPL 2/3 (GAUZE/BANDAGES/DRESSINGS) ×1 IMPLANT
BLADE CLIPPER SURG (BLADE) IMPLANT
BUR CARBIDE MATCH 3.0 (BURR) IMPLANT
BUR PRECISION FLUTE 5.0 (BURR) IMPLANT
CANISTER SUCT 3000ML PPV (MISCELLANEOUS) ×1 IMPLANT
DERMABOND ADVANCED .7 DNX12 (GAUZE/BANDAGES/DRESSINGS) IMPLANT
DRAPE LAPAROTOMY 100X72X124 (DRAPES) ×1 IMPLANT
DRAPE MICROSCOPE SLANT 54X150 (MISCELLANEOUS) ×1 IMPLANT
DRAPE SURG 17X23 STRL (DRAPES) ×1 IMPLANT
DRESSING MEPILEX FLEX 4X4 (GAUZE/BANDAGES/DRESSINGS) IMPLANT
DRSG MEPILEX FLEX 4X4 (GAUZE/BANDAGES/DRESSINGS)
DRSG OPSITE POSTOP 3X4 (GAUZE/BANDAGES/DRESSINGS) ×1 IMPLANT
DRSG OPSITE POSTOP 4X6 (GAUZE/BANDAGES/DRESSINGS) IMPLANT
DURAPREP 26ML APPLICATOR (WOUND CARE) ×1 IMPLANT
ELECT BLADE INSULATED 4IN (ELECTROSURGICAL) ×1
ELECT REM PT RETURN 9FT ADLT (ELECTROSURGICAL) ×1
ELECTRODE BLADE INSULATED 4IN (ELECTROSURGICAL) ×1 IMPLANT
ELECTRODE REM PT RTRN 9FT ADLT (ELECTROSURGICAL) ×1 IMPLANT
GAUZE 4X4 16PLY ~~LOC~~+RFID DBL (SPONGE) IMPLANT
GLOVE BIO SURGEON STRL SZ7 (GLOVE) ×2 IMPLANT
GLOVE BIOGEL PI IND STRL 7.0 (GLOVE) ×1 IMPLANT
GLOVE BIOGEL PI IND STRL 7.5 (GLOVE) ×3 IMPLANT
GLOVE ECLIPSE 7.5 STRL STRAW (GLOVE) ×1 IMPLANT
GLOVE EXAM NITRILE XL STR (GLOVE) IMPLANT
GOWN STRL REUS W/ TWL LRG LVL3 (GOWN DISPOSABLE) ×2 IMPLANT
GOWN STRL REUS W/ TWL XL LVL3 (GOWN DISPOSABLE) ×1 IMPLANT
GOWN STRL REUS W/TWL 2XL LVL3 (GOWN DISPOSABLE) IMPLANT
GOWN STRL REUS W/TWL LRG LVL3 (GOWN DISPOSABLE) ×2
GOWN STRL REUS W/TWL XL LVL3 (GOWN DISPOSABLE) ×1
HEMOSTAT POWDER KIT SURGIFOAM (HEMOSTASIS) ×1 IMPLANT
KIT BASIN OR (CUSTOM PROCEDURE TRAY) ×1 IMPLANT
KIT TURNOVER KIT B (KITS) ×1 IMPLANT
NDL SPNL 18GX3.5 QUINCKE PK (NEEDLE) IMPLANT
NEEDLE HYPO 22GX1.5 SAFETY (NEEDLE) ×1 IMPLANT
NEEDLE SPNL 18GX3.5 QUINCKE PK (NEEDLE) IMPLANT
NS IRRIG 1000ML POUR BTL (IV SOLUTION) ×1 IMPLANT
PACK LAMINECTOMY NEURO (CUSTOM PROCEDURE TRAY) ×1 IMPLANT
PAD ARMBOARD 7.5X6 YLW CONV (MISCELLANEOUS) ×3 IMPLANT
SPIKE FLUID TRANSFER (MISCELLANEOUS) ×1 IMPLANT
SPONGE SURGIFOAM ABS GEL SZ50 (HEMOSTASIS) IMPLANT
SPONGE T-LAP 4X18 ~~LOC~~+RFID (SPONGE) IMPLANT
STRIP CLOSURE SKIN 1/2X4 (GAUZE/BANDAGES/DRESSINGS) ×1 IMPLANT
SUT MNCRL AB 4-0 PS2 18 (SUTURE) ×1 IMPLANT
SUT VIC AB 0 CT1 18XCR BRD8 (SUTURE) ×1 IMPLANT
SUT VIC AB 0 CT1 8-18 (SUTURE) ×1
SUT VIC AB 2-0 CP2 18 (SUTURE) ×1 IMPLANT
SYR 3ML LL SCALE MARK (SYRINGE) IMPLANT
TOWEL GREEN STERILE (TOWEL DISPOSABLE) ×1 IMPLANT
TOWEL GREEN STERILE FF (TOWEL DISPOSABLE) ×1 IMPLANT
WATER STERILE IRR 1000ML POUR (IV SOLUTION) ×1 IMPLANT

## 2023-02-17 NOTE — Anesthesia Procedure Notes (Signed)
Procedure Name: Intubation Date/Time: 02/17/2023 1:56 PM  Performed by: Lind Guest, CRNAPre-anesthesia Checklist: Patient identified, Emergency Drugs available, Suction available, Patient being monitored and Timeout performed Patient Re-evaluated:Patient Re-evaluated prior to induction Oxygen Delivery Method: Circle system utilized Preoxygenation: Pre-oxygenation with 100% oxygen Induction Type: IV induction Ventilation: Mask ventilation without difficulty and Oral airway inserted - appropriate to patient size Laryngoscope Size: Mac and 3 Grade View: Grade I Tube type: Oral Tube size: 7.0 mm Number of attempts: 1 Placement Confirmation: ETT inserted through vocal cords under direct vision, positive ETCO2 and breath sounds checked- equal and bilateral Secured at: 22 cm Tube secured with: Tape Dental Injury: Teeth and Oropharynx as per pre-operative assessment

## 2023-02-17 NOTE — Discharge Instructions (Addendum)
Can remove transparent dressing and shower 48 hours after surgery Walk as much as possible No heavy lifting >10 lbs No excessive bending/twisting at the waist  

## 2023-02-17 NOTE — Op Note (Signed)
PREOP DIAGNOSIS: Lumbar spinal stenosis, L1-2  POSTOP DIAGNOSIS: Lumbar spinal stenosis, L1-2  PROCEDURE: 1. L1-2 laminectomy, bilateral medial facetectomy and foraminotomy 2. Use of microscope for intraoperative microdissection  SURGEON: Dr. Duffy Rhody, MD  ASSISTANT: Caroline More, PA. Please note, there were no qualified trainees available to assist with the procedure.  An assistant was required for aid in retraction of the neural elements.   ANESTHESIA: General Endotracheal  EBL: 25 ml  SPECIMENS: None  DRAINS: None  COMPLICATIONS: none  CONDITION: Stable to PCAU  HISTORY: Nicole Nash is a 77 y.o. female  with hx of L3-S1 PLD who initially presented to the outpatient clinic with symptoms consistent with neurogenic claudication. MRI showed severe stenosis at L1-2. Treatment options were discussed and the patient elected to proceed with surgical decompression by laminectomy and foraminotomies at L1-2.  PROCEDURE IN DETAIL: After informed consent was obtained and witnessed, the patient was brought to the operating room. After induction of general anesthesia, the patient was positioned on the operative table in the prone position with all pressure points meticulously padded. The skin of the low back was then prepped and draped in the usual sterile fashion.  Under fluoroscopy, the correct levels were identified and marked out on the skin, and after timeout was conducted, the skin was infiltrated with local anesthetic. Skin incision was then made sharply and Bovie electrocautery was used to dissect the subcutaneous tissue until the lumbodorsal fascia was identified. The fascia was then incised using Bovie electrocautery and the lamina at the L1-2 levels was identified and dissection was carried out in the subperiosteal plane. Self-retaining retractor was then placed, and intraoperative x-ray was taken to confirm we were at the correct levels.  Microscope was then introduced  into the field to allow for intraoperative microdissection.  Interspinous ligament was removed with Leksell rongeur.  Using a high-speed drill, inferior lamina of L1 and superior lamina of L2 was removed, and bilateral medial facetectomies were performed. The ligamentum flavum was then identified and removed and the lateral edge of the thecal sac was identified bilaterally. Using a combination of the high-speed drill and rongeurs, good lateral decompression was completed. Kerrison rongeurs were then used to complete foraminotomies at L1-2 bilaterally. The decompression was confirmed using a small ball tip dissector.  Hemostasis was then secured using a combination of morcellized Gelfoam and thrombin and bipolar electrocautery. The wound was irrigated copiously.  Depo medrol was placed over the thecal sac and nerve roots.  Marcaine was injected into the paraspinous muscles and soft tissues.  Self-retaining retractor was then removed, and the wound is closed in layers using a combination of interrupted 0 Vicryl and 2-0 Vicryl stitches followed by 4-0 monocryl in subcuticular manner and Dermabond.  A sterile dressing was placed.   At the end of the case all sponge, needle, and instrument counts were correct. The patient was then transferred to the stretcher and taken to the postanesthesia care unit in stable hemodynamic condition.

## 2023-02-17 NOTE — Transfer of Care (Signed)
Immediate Anesthesia Transfer of Care Note  Patient: Nicole Nash  Procedure(s) Performed: POSTERIOR LUMBAR DECOMPESSION W/FORAMINOTOMY, MEDIAL FACETECTOMIES, LUMBAR ONE-LUMBAR TWO  Patient Location: PACU  Anesthesia Type:General  Level of Consciousness: sedated and patient cooperative  Airway & Oxygen Therapy: Patient Spontanous Breathing and Patient connected to face mask oxygen  Post-op Assessment: Report given to RN and Post -op Vital signs reviewed and stable  Post vital signs: Reviewed and stable  Last Vitals:  Vitals Value Taken Time  BP 168/60 02/17/23 1548  Temp 36.3 C 02/17/23 1550  Pulse 72 02/17/23 1552  Resp 17 02/17/23 1552  SpO2 100 % 02/17/23 1552  Vitals shown include unvalidated device data.  Last Pain:  Vitals:   02/17/23 1137  TempSrc:   PainSc: 0-No pain         Complications: No notable events documented.

## 2023-02-17 NOTE — H&P (Signed)
CC: lumbar stenosis  HPI:     Patient is a 77 y.o. female presents with neurogenic claudication symptoms, was found to have severe L1-2 stenosis.  She is here for elective surgery.    Patient Active Problem List   Diagnosis Date Noted   Hypertension 04/02/2022   Dyspnea 02/26/2022   Past Medical History:  Diagnosis Date   Arthritis    knees, shoulders, back neck per pt   COPD (chronic obstructive pulmonary disease) ([place])    Depression    Dyspnea    ex-smoker, hx of COPD   Hyperlipidemia    Hypertension     Past Surgical History:  Procedure Laterality Date   APPENDECTOMY  12/07/1973   BACK SURGERY  12/07/2009   CATARACT EXTRACTION W/PHACO Right 08/28/2013   Procedure: CATARACT EXTRACTION PHACO AND INTRAOCULAR LENS PLACEMENT ([place]);  Surgeon: Tonny Branch, MD;  Location: [facility];  Service: Ophthalmology;  Laterality: Right;  CDE:  9.15   CATARACT EXTRACTION W/PHACO Left 09/07/2013   Procedure: CATARACT EXTRACTION PHACO AND INTRAOCULAR LENS PLACEMENT ([facility]);  Surgeon: Tonny Branch, MD;  Location: [facility];  Service: Ophthalmology;  Laterality: Left;  CDE:8.47   DILATION AND CURETTAGE OF UTERUS     NECK SURGERY  12/07/1964   RENAL ARTERY STENT  12/07/1998   RIGHT HEART CATH N/A 08/27/2022   Procedure: RIGHT HEART CATH;  Surgeon: Martinique, Peter M, MD;  Location: [facility];  Service: Cardiovascular;  Laterality: N/A;   TONSILLECTOMY  12/07/1956   TUBAL LIGATION  12/07/1973   WRIST SURGERY Right 12/08/2003    Facility-Administered Medications Prior to Admission  Medication Dose Route Frequency Provider Last Rate Last Admin   sodium chloride flush (NS) 0.9 % injection 3 mL  3 mL Intravenous Q12H Arnoldo Lenis, MD       Medications Prior to Admission  Medication Sig Dispense Refill Last Dose   acetaminophen (TYLENOL) 650 MG CR tablet Take 650 mg by mouth every 8 (eight) hours as needed for pain.   Past Month   amLODipine (NORVASC) 10 MG tablet Take 10 mg by mouth in the  morning.   02/17/2023 at 0730   atorvastatin (LIPITOR) 40 MG tablet Take 40 mg by mouth daily with supper.   02/17/2023 at 0730   docusate sodium (COLACE) 100 MG capsule Take 200 mg by mouth daily as needed for mild constipation.   Past Week   folic acid (FOLVITE) [id number] MCG tablet Take 800 mcg by mouth in the morning.   02/16/2023   levocetirizine (XYZAL) 5 MG tablet Take 5 mg by mouth at bedtime.   Past Week   LORazepam (ATIVAN) 1 MG tablet Take 0.5 mg by mouth 2 (two) times daily.  2 02/17/2023 at 0730   losartan (COZAAR) 100 MG tablet Take 100 mg by mouth in the morning.   02/16/2023   metoprolol succinate (TOPROL-XL) 25 MG 24 hr tablet Take 25 mg by mouth in the morning.   02/17/2023 at 0730   Multiple Vitamin (MULTIVITAMIN WITH MINERALS) TABS tablet Take 1 tablet by mouth daily. Centrum for Women 50+   Past Week   ofloxacin (FLOXIN) 0.3 % OTIC solution Place 10 drops into the right ear at bedtime.   Past Week   omeprazole (PRILOSEC) 20 MG capsule Take 20 mg by mouth daily as needed (acid reflux/indigestion.).   Past Month   furosemide (LASIX) 20 MG tablet Take 1 tablet (20 mg total) by mouth daily as needed. For swelling 90 tablet 3 More  than a month   metoprolol tartrate (LOPRESSOR) 50 MG tablet Take 50 mg 2 hours before Cardiac CT (Patient not taking: Reported on 10/23/2022) 1 tablet 0 Not Taking   Allergies  Allergen Reactions   Codeine Anaphylaxis and Shortness Of Breath    Social History   Tobacco Use   Smoking status: Former    Types: Cigarettes    Quit date: 08/24/2011    Years since quitting: 11.4   Smokeless tobacco: Never  Substance Use Topics   Alcohol use: Not Currently    Family History  Problem Relation Age of Onset   Breast cancer Neg Hx      Review of Systems Pertinent items are noted in HPI.  Objective:   Patient Vitals for the past 8 hrs:  BP Temp Temp src Pulse Resp SpO2 Height Weight  02/17/23 1112 (!) 154/62 97.7 F (36.5 C) Oral 74 17 96 % '5\' 3"'$  (1.6 m)  79.8 kg   No intake/output data recorded. No intake/output data recorded.      General : Alert, cooperative, no distress, appears stated age   Head:  Normocephalic/atraumatic    Eyes: PERRL, conjunctiva/corneas clear, EOM's intact. Fundi could not be visualized Neck: Supple Chest:  Respirations unlabored Chest wall: no tenderness or deformity Heart: Regular rate and rhythm Abdomen: Soft, nontender and nondistended Extremities: warm and well-perfused Skin: normal turgor, color and texture Neurologic:  Alert, oriented x 3.  Eyes open spontaneously. PERRL, EOMI, VFC, no facial droop. V1-3 intact.  No dysarthria, tongue protrusion symmetric.  CNII-XII intact. Normal strength, sensation and reflexes throughout.  Well healed midline scar       Data ReviewCBC:  Lab Results  Component Value Date   WBC 6.5 02/10/2023   RBC 4.15 02/10/2023   BMP:  Lab Results  Component Value Date   GLUCOSE 104 (H) 02/10/2023   CO2 28 02/10/2023   BUN 10 02/10/2023   CREATININE 1.20 (H) 02/10/2023   CALCIUM 9.3 02/10/2023   Radiology review:  See clinic note for details  Assessment:   Active Problems:   * No active hospital problems. * Hx of L3-S1 PLD, now with L1-2 stenosis with neurogenic claudication.  Plan:  - L1-2 PLD today

## 2023-02-17 NOTE — Anesthesia Postprocedure Evaluation (Signed)
Anesthesia Post Note  Patient: Nicole Nash  Procedure(s) Performed: POSTERIOR LUMBAR DECOMPESSION W/FORAMINOTOMY, MEDIAL FACETECTOMIES, LUMBAR ONE-LUMBAR TWO     Patient location during evaluation: PACU Anesthesia Type: General Level of consciousness: awake and alert Pain management: pain level controlled Vital Signs Assessment: post-procedure vital signs reviewed and stable Respiratory status: spontaneous breathing, nonlabored ventilation and respiratory function stable Cardiovascular status: stable and blood pressure returned to baseline Anesthetic complications: no   No notable events documented.  Last Vitals:  Vitals:   02/17/23 1645 02/17/23 1647  BP:  96/71  Pulse: 69 68  Resp: 17 12  Temp:  (!) 36.2 C  SpO2: 95% 92%    Last Pain:  Vitals:   02/17/23 1630  TempSrc:   PainSc: [place]

## 2023-02-18 ENCOUNTER — Encounter (HOSPITAL_COMMUNITY): Payer: Self-pay | Admitting: Neurosurgery

## 2023-03-06 DIAGNOSIS — I10 Essential (primary) hypertension: Secondary | ICD-10-CM | POA: Diagnosis not present

## 2023-03-30 DIAGNOSIS — I10 Essential (primary) hypertension: Secondary | ICD-10-CM | POA: Diagnosis not present

## 2023-03-30 DIAGNOSIS — Z299 Encounter for prophylactic measures, unspecified: Secondary | ICD-10-CM | POA: Diagnosis not present

## 2023-03-30 DIAGNOSIS — Z1211 Encounter for screening for malignant neoplasm of colon: Secondary | ICD-10-CM | POA: Diagnosis not present

## 2023-03-30 DIAGNOSIS — I471 Supraventricular tachycardia, unspecified: Secondary | ICD-10-CM | POA: Diagnosis not present

## 2023-03-30 DIAGNOSIS — I739 Peripheral vascular disease, unspecified: Secondary | ICD-10-CM | POA: Diagnosis not present

## 2023-03-30 DIAGNOSIS — Z0000 Encounter for general adult medical examination without abnormal findings: Secondary | ICD-10-CM | POA: Diagnosis not present

## 2023-03-30 DIAGNOSIS — J449 Chronic obstructive pulmonary disease, unspecified: Secondary | ICD-10-CM | POA: Diagnosis not present

## 2023-04-06 DIAGNOSIS — I10 Essential (primary) hypertension: Secondary | ICD-10-CM | POA: Diagnosis not present

## 2023-04-28 ENCOUNTER — Other Ambulatory Visit: Payer: Self-pay | Admitting: Internal Medicine

## 2023-04-28 DIAGNOSIS — Z1231 Encounter for screening mammogram for malignant neoplasm of breast: Secondary | ICD-10-CM

## 2023-05-07 DIAGNOSIS — I10 Essential (primary) hypertension: Secondary | ICD-10-CM | POA: Diagnosis not present

## 2023-05-11 IMAGING — MG MM DIGITAL SCREENING BILAT W/ TOMO AND CAD
8 series · 8 of 24 positions shown · non-contrast
Comparison: Previous exam(s).

ACR Breast Density Category a: The breast tissue is almost entirely
fatty.

CLINICAL DATA: Screening.

EXAM:
DIGITAL SCREENING BILATERAL MAMMOGRAM WITH TOMOSYNTHESIS AND CAD
TECHNIQUE: Bilateral screening digital craniocaudal and mediolateral oblique
mammograms were obtained. Bilateral screening digital breast
tomosynthesis was performed. The images were evaluated with
computer-aided detection.

[L MLO synth-2D]
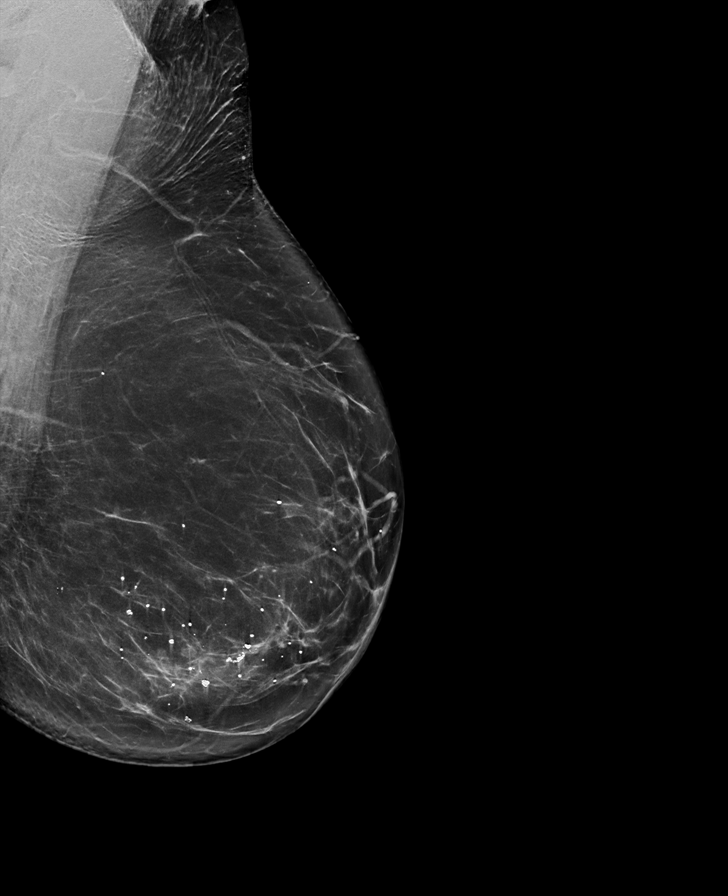

[R MLO synth-2D]
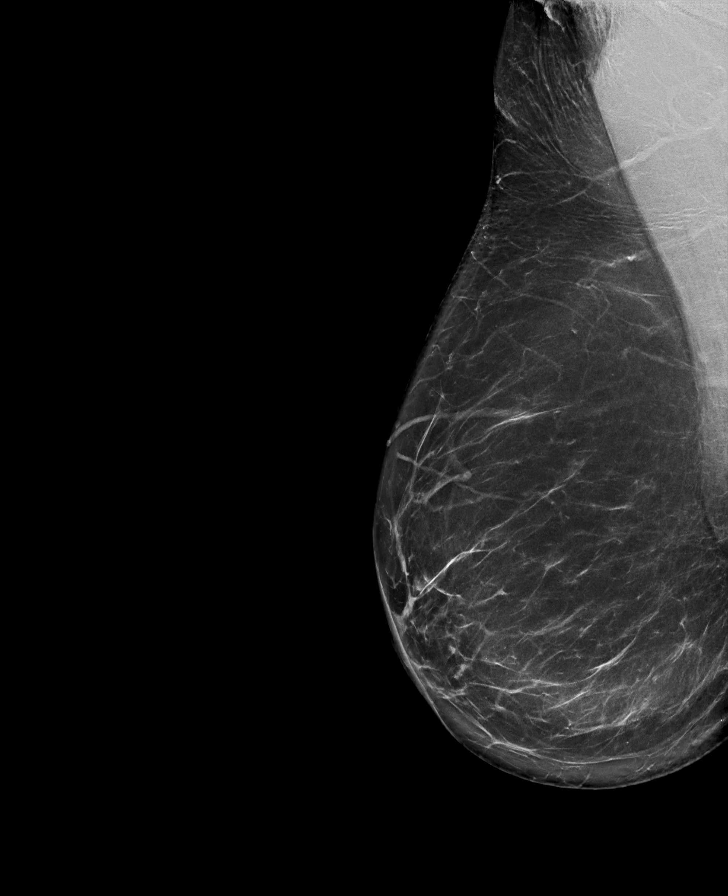

[L CC synth-2D]
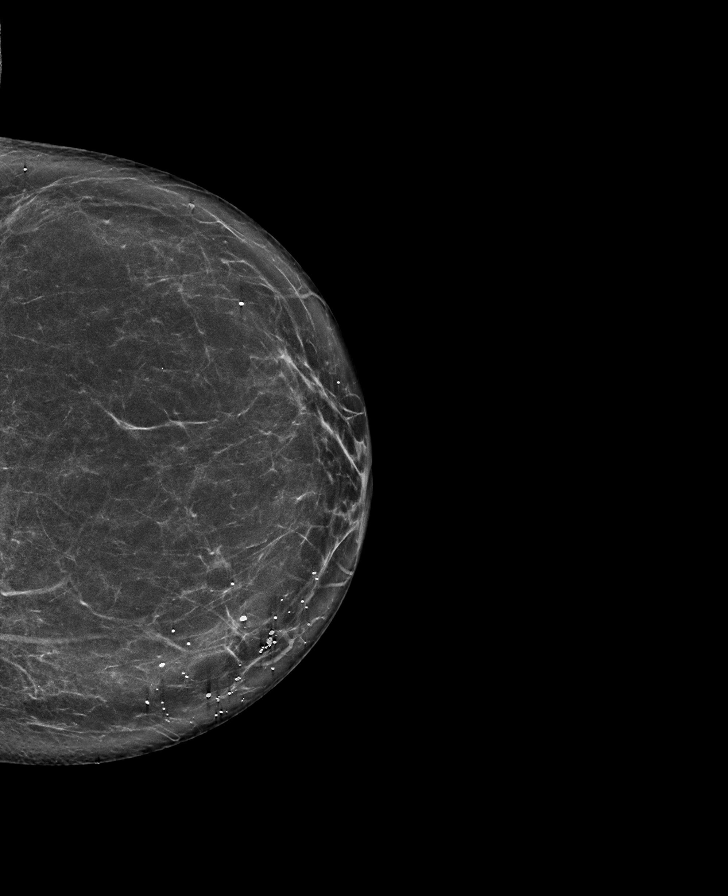

[R CC synth-2D]
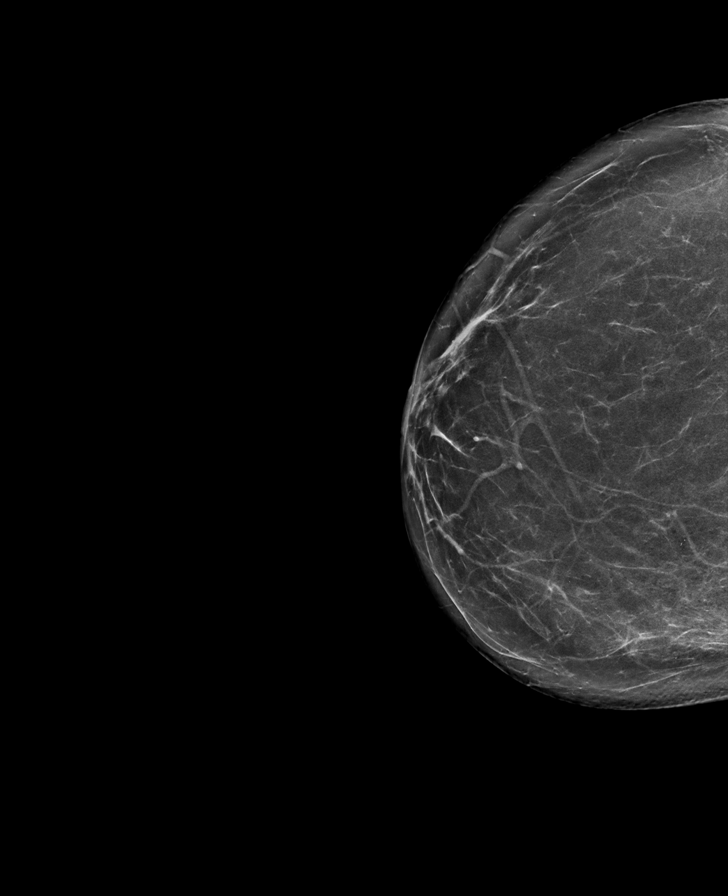

[R MLO tomo · tomo slice 44/87.0]
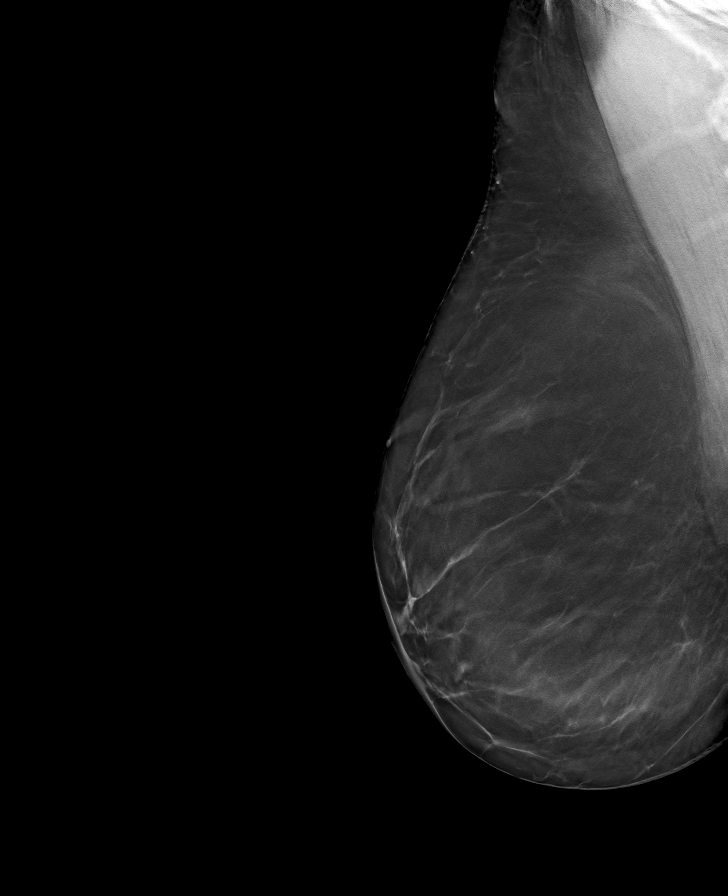

[L CC tomo · tomo slice 37/74.0]
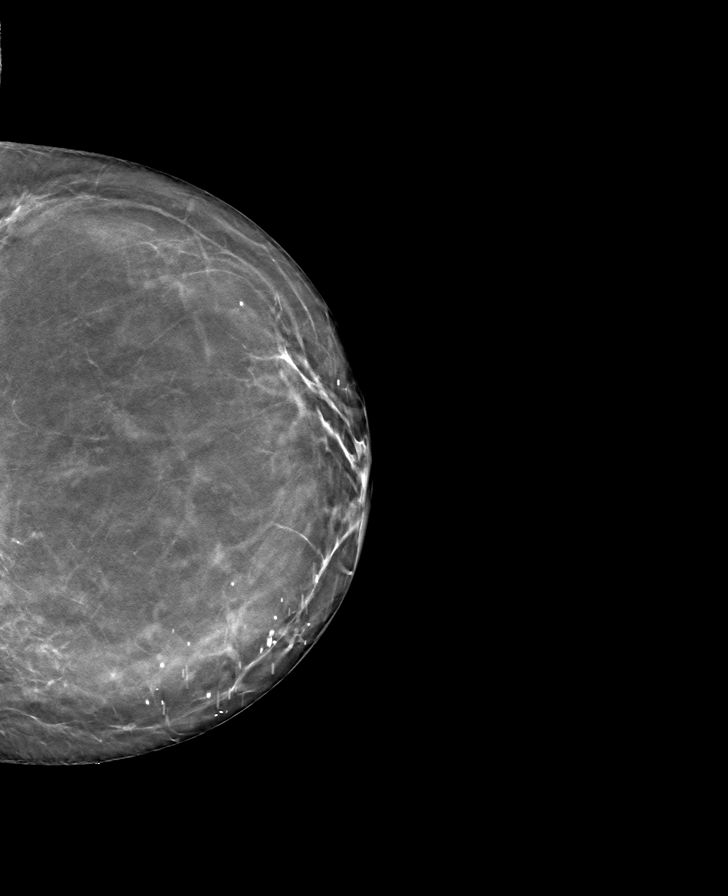

[L MLO tomo · tomo slice 43/85.0]
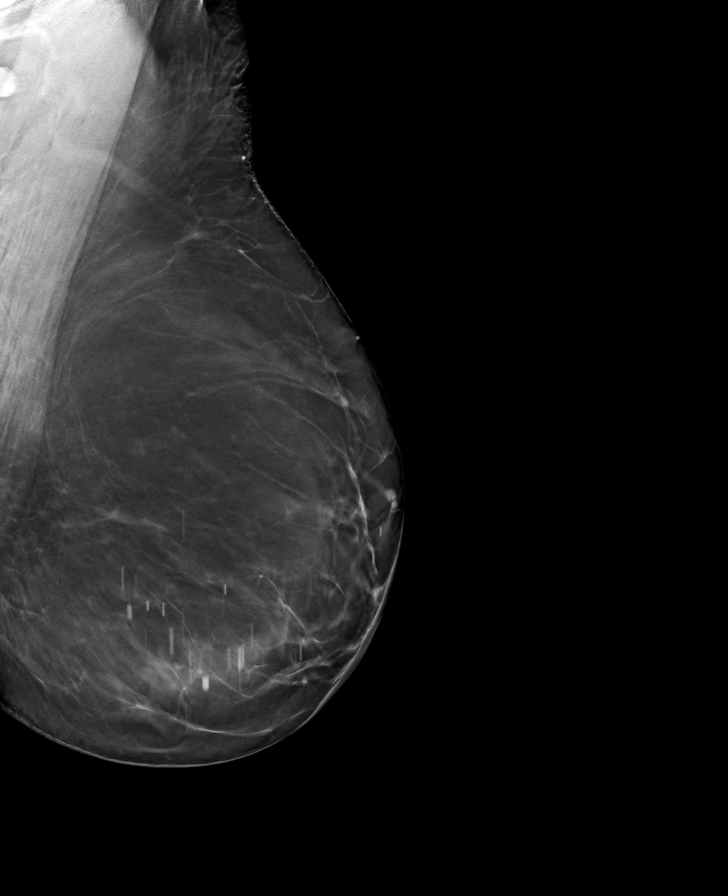

[R CC tomo · tomo slice 39/77.0]
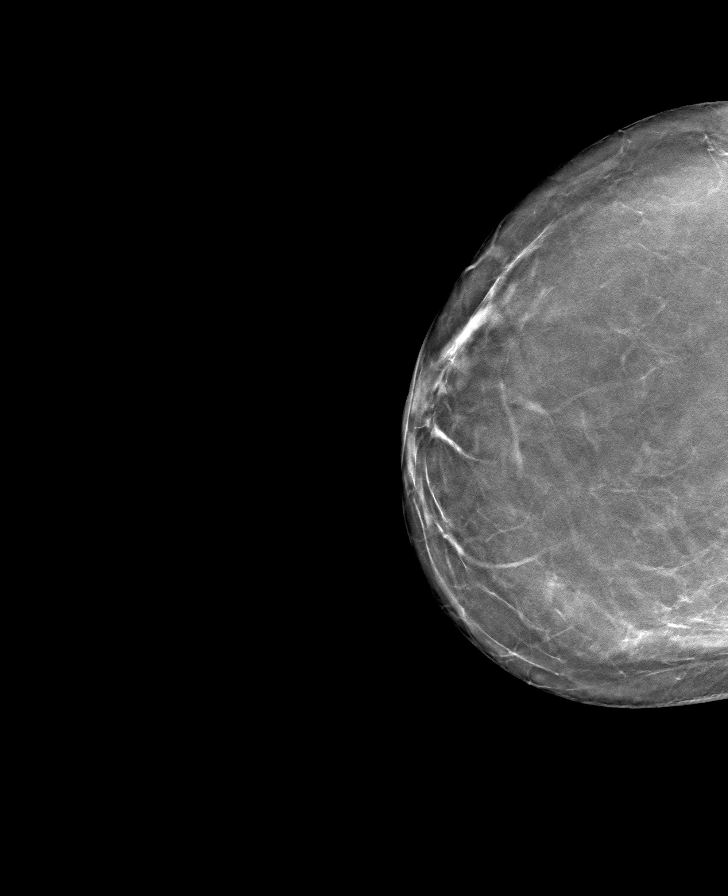

[8 of 24 positions shown; findings below may reference images not displayed]

FINDINGS: There are no findings suspicious for malignancy.
IMPRESSION: No mammographic evidence of malignancy. A result letter of this
screening mammogram will be mailed directly to the patient.

RECOMMENDATION:
Screening mammogram in one year. (Code:0E-3-N98)

BI-RADS CATEGORY  1: Negative.

## 2023-05-18 ENCOUNTER — Ambulatory Visit
Admission: RE | Admit: 2023-05-18 | Discharge: 2023-05-18 | Disposition: A | Discharge status: Home / Self Care | Payer: Medicare Other | Source: Ambulatory Visit | Attending: Internal Medicine | Admitting: Internal Medicine

## 2023-05-18 ENCOUNTER — Other Ambulatory Visit: Payer: Self-pay | Admitting: Family Medicine

## 2023-05-18 DIAGNOSIS — Z1231 Encounter for screening mammogram for malignant neoplasm of breast: Secondary | ICD-10-CM

## 2023-05-26 DIAGNOSIS — Z299 Encounter for prophylactic measures, unspecified: Secondary | ICD-10-CM | POA: Diagnosis not present

## 2023-05-26 DIAGNOSIS — J449 Chronic obstructive pulmonary disease, unspecified: Secondary | ICD-10-CM | POA: Diagnosis not present

## 2023-05-26 DIAGNOSIS — M7989 Other specified soft tissue disorders: Secondary | ICD-10-CM | POA: Diagnosis not present

## 2023-05-26 DIAGNOSIS — R0602 Shortness of breath: Secondary | ICD-10-CM | POA: Diagnosis not present

## 2023-05-26 DIAGNOSIS — M85871 Other specified disorders of bone density and structure, right ankle and foot: Secondary | ICD-10-CM | POA: Diagnosis not present

## 2023-05-26 DIAGNOSIS — I10 Essential (primary) hypertension: Secondary | ICD-10-CM | POA: Diagnosis not present

## 2023-05-26 DIAGNOSIS — M25571 Pain in right ankle and joints of right foot: Secondary | ICD-10-CM | POA: Diagnosis not present

## 2023-05-26 DIAGNOSIS — M7552 Bursitis of left shoulder: Secondary | ICD-10-CM | POA: Diagnosis not present

## 2023-06-02 DIAGNOSIS — I10 Essential (primary) hypertension: Secondary | ICD-10-CM | POA: Diagnosis not present

## 2023-06-02 DIAGNOSIS — Z299 Encounter for prophylactic measures, unspecified: Secondary | ICD-10-CM | POA: Diagnosis not present

## 2023-06-02 DIAGNOSIS — M25571 Pain in right ankle and joints of right foot: Secondary | ICD-10-CM | POA: Diagnosis not present

## 2023-06-02 DIAGNOSIS — R609 Edema, unspecified: Secondary | ICD-10-CM | POA: Diagnosis not present

## 2023-06-02 DIAGNOSIS — R197 Diarrhea, unspecified: Secondary | ICD-10-CM | POA: Diagnosis not present

## 2023-07-26 DIAGNOSIS — M7661 Achilles tendinitis, right leg: Secondary | ICD-10-CM | POA: Diagnosis not present

## 2023-07-26 DIAGNOSIS — M79671 Pain in right foot: Secondary | ICD-10-CM | POA: Diagnosis not present

## 2023-07-26 DIAGNOSIS — M7731 Calcaneal spur, right foot: Secondary | ICD-10-CM | POA: Diagnosis not present

## 2023-08-13 DIAGNOSIS — M25571 Pain in right ankle and joints of right foot: Secondary | ICD-10-CM | POA: Diagnosis not present

## 2023-08-13 DIAGNOSIS — J449 Chronic obstructive pulmonary disease, unspecified: Secondary | ICD-10-CM | POA: Diagnosis not present

## 2023-08-13 DIAGNOSIS — Z0000 Encounter for general adult medical examination without abnormal findings: Secondary | ICD-10-CM | POA: Diagnosis not present

## 2023-08-13 DIAGNOSIS — R5383 Other fatigue: Secondary | ICD-10-CM | POA: Diagnosis not present

## 2023-08-13 DIAGNOSIS — I10 Essential (primary) hypertension: Secondary | ICD-10-CM | POA: Diagnosis not present

## 2023-08-13 DIAGNOSIS — Z7189 Other specified counseling: Secondary | ICD-10-CM | POA: Diagnosis not present

## 2023-08-13 DIAGNOSIS — E7800 Pure hypercholesterolemia, unspecified: Secondary | ICD-10-CM | POA: Diagnosis not present

## 2023-08-13 DIAGNOSIS — Z79899 Other long term (current) drug therapy: Secondary | ICD-10-CM | POA: Diagnosis not present

## 2023-08-13 DIAGNOSIS — Z299 Encounter for prophylactic measures, unspecified: Secondary | ICD-10-CM | POA: Diagnosis not present

## 2023-08-16 DIAGNOSIS — M79671 Pain in right foot: Secondary | ICD-10-CM | POA: Diagnosis not present

## 2023-08-16 DIAGNOSIS — M7751 Other enthesopathy of right foot: Secondary | ICD-10-CM | POA: Diagnosis not present

## 2023-08-16 DIAGNOSIS — M9261 Juvenile osteochondrosis of tarsus, right ankle: Secondary | ICD-10-CM | POA: Diagnosis not present

## 2023-08-25 DIAGNOSIS — R262 Difficulty in walking, not elsewhere classified: Secondary | ICD-10-CM | POA: Diagnosis not present

## 2023-08-25 DIAGNOSIS — M25571 Pain in right ankle and joints of right foot: Secondary | ICD-10-CM | POA: Diagnosis not present

## 2023-09-01 DIAGNOSIS — Z299 Encounter for prophylactic measures, unspecified: Secondary | ICD-10-CM | POA: Diagnosis not present

## 2023-09-01 DIAGNOSIS — I10 Essential (primary) hypertension: Secondary | ICD-10-CM | POA: Diagnosis not present

## 2023-09-01 DIAGNOSIS — Z23 Encounter for immunization: Secondary | ICD-10-CM | POA: Diagnosis not present

## 2023-09-01 DIAGNOSIS — M25571 Pain in right ankle and joints of right foot: Secondary | ICD-10-CM | POA: Diagnosis not present

## 2023-09-07 DIAGNOSIS — R262 Difficulty in walking, not elsewhere classified: Secondary | ICD-10-CM | POA: Diagnosis not present

## 2023-09-07 DIAGNOSIS — M25571 Pain in right ankle and joints of right foot: Secondary | ICD-10-CM | POA: Diagnosis not present

## 2023-09-09 DIAGNOSIS — R262 Difficulty in walking, not elsewhere classified: Secondary | ICD-10-CM | POA: Diagnosis not present

## 2023-09-09 DIAGNOSIS — M25571 Pain in right ankle and joints of right foot: Secondary | ICD-10-CM | POA: Diagnosis not present

## 2023-09-13 DIAGNOSIS — M818 Other osteoporosis without current pathological fracture: Secondary | ICD-10-CM | POA: Diagnosis not present

## 2023-09-14 DIAGNOSIS — M25571 Pain in right ankle and joints of right foot: Secondary | ICD-10-CM | POA: Diagnosis not present

## 2023-09-14 DIAGNOSIS — R262 Difficulty in walking, not elsewhere classified: Secondary | ICD-10-CM | POA: Diagnosis not present

## 2023-09-16 DIAGNOSIS — M25571 Pain in right ankle and joints of right foot: Secondary | ICD-10-CM | POA: Diagnosis not present

## 2023-09-16 DIAGNOSIS — R262 Difficulty in walking, not elsewhere classified: Secondary | ICD-10-CM | POA: Diagnosis not present

## 2023-09-21 DIAGNOSIS — R262 Difficulty in walking, not elsewhere classified: Secondary | ICD-10-CM | POA: Diagnosis not present

## 2023-09-21 DIAGNOSIS — M25571 Pain in right ankle and joints of right foot: Secondary | ICD-10-CM | POA: Diagnosis not present

## 2023-09-28 DIAGNOSIS — M25571 Pain in right ankle and joints of right foot: Secondary | ICD-10-CM | POA: Diagnosis not present

## 2023-09-28 DIAGNOSIS — R262 Difficulty in walking, not elsewhere classified: Secondary | ICD-10-CM | POA: Diagnosis not present

## 2023-09-29 DIAGNOSIS — I10 Essential (primary) hypertension: Secondary | ICD-10-CM | POA: Diagnosis not present

## 2023-09-29 DIAGNOSIS — R339 Retention of urine, unspecified: Secondary | ICD-10-CM | POA: Diagnosis not present

## 2023-09-29 DIAGNOSIS — N390 Urinary tract infection, site not specified: Secondary | ICD-10-CM | POA: Diagnosis not present

## 2023-09-29 DIAGNOSIS — R350 Frequency of micturition: Secondary | ICD-10-CM | POA: Diagnosis not present

## 2023-09-29 DIAGNOSIS — Z299 Encounter for prophylactic measures, unspecified: Secondary | ICD-10-CM | POA: Diagnosis not present

## 2023-09-30 DIAGNOSIS — M25571 Pain in right ankle and joints of right foot: Secondary | ICD-10-CM | POA: Diagnosis not present

## 2023-09-30 DIAGNOSIS — R262 Difficulty in walking, not elsewhere classified: Secondary | ICD-10-CM | POA: Diagnosis not present

## 2023-10-05 DIAGNOSIS — M25571 Pain in right ankle and joints of right foot: Secondary | ICD-10-CM | POA: Diagnosis not present

## 2023-10-05 DIAGNOSIS — R262 Difficulty in walking, not elsewhere classified: Secondary | ICD-10-CM | POA: Diagnosis not present

## 2023-10-07 DIAGNOSIS — R262 Difficulty in walking, not elsewhere classified: Secondary | ICD-10-CM | POA: Diagnosis not present

## 2023-10-07 DIAGNOSIS — M25571 Pain in right ankle and joints of right foot: Secondary | ICD-10-CM | POA: Diagnosis not present

## 2023-10-12 DIAGNOSIS — R262 Difficulty in walking, not elsewhere classified: Secondary | ICD-10-CM | POA: Diagnosis not present

## 2023-10-12 DIAGNOSIS — M25571 Pain in right ankle and joints of right foot: Secondary | ICD-10-CM | POA: Diagnosis not present

## 2023-10-14 DIAGNOSIS — R262 Difficulty in walking, not elsewhere classified: Secondary | ICD-10-CM | POA: Diagnosis not present

## 2023-10-14 DIAGNOSIS — M25571 Pain in right ankle and joints of right foot: Secondary | ICD-10-CM | POA: Diagnosis not present

## 2023-10-20 DIAGNOSIS — M25571 Pain in right ankle and joints of right foot: Secondary | ICD-10-CM | POA: Diagnosis not present

## 2023-10-20 DIAGNOSIS — R262 Difficulty in walking, not elsewhere classified: Secondary | ICD-10-CM | POA: Diagnosis not present

## 2023-10-26 DIAGNOSIS — R262 Difficulty in walking, not elsewhere classified: Secondary | ICD-10-CM | POA: Diagnosis not present

## 2023-10-26 DIAGNOSIS — M25571 Pain in right ankle and joints of right foot: Secondary | ICD-10-CM | POA: Diagnosis not present

## 2023-11-08 DIAGNOSIS — M25571 Pain in right ankle and joints of right foot: Secondary | ICD-10-CM | POA: Diagnosis not present

## 2023-11-08 DIAGNOSIS — R262 Difficulty in walking, not elsewhere classified: Secondary | ICD-10-CM | POA: Diagnosis not present

## 2023-11-09 DIAGNOSIS — Z1211 Encounter for screening for malignant neoplasm of colon: Secondary | ICD-10-CM | POA: Diagnosis not present

## 2023-11-10 DIAGNOSIS — M25571 Pain in right ankle and joints of right foot: Secondary | ICD-10-CM | POA: Diagnosis not present

## 2023-11-10 DIAGNOSIS — R262 Difficulty in walking, not elsewhere classified: Secondary | ICD-10-CM | POA: Diagnosis not present

## 2023-11-16 DIAGNOSIS — M25571 Pain in right ankle and joints of right foot: Secondary | ICD-10-CM | POA: Diagnosis not present

## 2023-11-16 DIAGNOSIS — R262 Difficulty in walking, not elsewhere classified: Secondary | ICD-10-CM | POA: Diagnosis not present

## 2023-11-19 DIAGNOSIS — N189 Chronic kidney disease, unspecified: Secondary | ICD-10-CM | POA: Diagnosis not present

## 2023-11-19 DIAGNOSIS — D128 Benign neoplasm of rectum: Secondary | ICD-10-CM | POA: Diagnosis not present

## 2023-11-19 DIAGNOSIS — D122 Benign neoplasm of ascending colon: Secondary | ICD-10-CM | POA: Diagnosis not present

## 2023-11-19 DIAGNOSIS — K621 Rectal polyp: Secondary | ICD-10-CM | POA: Diagnosis not present

## 2023-11-19 DIAGNOSIS — J988 Other specified respiratory disorders: Secondary | ICD-10-CM | POA: Diagnosis not present

## 2023-11-19 DIAGNOSIS — D129 Benign neoplasm of anus and anal canal: Secondary | ICD-10-CM | POA: Diagnosis not present

## 2023-11-19 DIAGNOSIS — Z860102 Personal history of hyperplastic colon polyps: Secondary | ICD-10-CM | POA: Diagnosis not present

## 2023-11-19 DIAGNOSIS — I129 Hypertensive chronic kidney disease with stage 1 through stage 4 chronic kidney disease, or unspecified chronic kidney disease: Secondary | ICD-10-CM | POA: Diagnosis not present

## 2023-11-19 DIAGNOSIS — D124 Benign neoplasm of descending colon: Secondary | ICD-10-CM | POA: Diagnosis not present

## 2023-11-19 DIAGNOSIS — I2510 Atherosclerotic heart disease of native coronary artery without angina pectoris: Secondary | ICD-10-CM | POA: Diagnosis not present

## 2023-11-19 DIAGNOSIS — Z7951 Long term (current) use of inhaled steroids: Secondary | ICD-10-CM | POA: Diagnosis not present

## 2023-11-19 DIAGNOSIS — Z885 Allergy status to narcotic agent status: Secondary | ICD-10-CM | POA: Diagnosis not present

## 2023-11-19 DIAGNOSIS — M5033 Other cervical disc degeneration, cervicothoracic region: Secondary | ICD-10-CM | POA: Diagnosis not present

## 2023-11-19 DIAGNOSIS — K635 Polyp of colon: Secondary | ICD-10-CM | POA: Diagnosis not present

## 2023-11-19 DIAGNOSIS — Z87891 Personal history of nicotine dependence: Secondary | ICD-10-CM | POA: Diagnosis not present

## 2023-11-19 DIAGNOSIS — Z1211 Encounter for screening for malignant neoplasm of colon: Secondary | ICD-10-CM | POA: Diagnosis not present

## 2023-11-19 DIAGNOSIS — Z79899 Other long term (current) drug therapy: Secondary | ICD-10-CM | POA: Diagnosis not present

## 2023-11-22 DIAGNOSIS — R110 Nausea: Secondary | ICD-10-CM | POA: Diagnosis not present

## 2023-11-22 DIAGNOSIS — D692 Other nonthrombocytopenic purpura: Secondary | ICD-10-CM | POA: Diagnosis not present

## 2023-11-22 DIAGNOSIS — Z299 Encounter for prophylactic measures, unspecified: Secondary | ICD-10-CM | POA: Diagnosis not present

## 2023-11-22 DIAGNOSIS — R350 Frequency of micturition: Secondary | ICD-10-CM | POA: Diagnosis not present

## 2023-11-22 DIAGNOSIS — B379 Candidiasis, unspecified: Secondary | ICD-10-CM | POA: Diagnosis not present

## 2023-11-22 DIAGNOSIS — I10 Essential (primary) hypertension: Secondary | ICD-10-CM | POA: Diagnosis not present

## 2023-12-09 DIAGNOSIS — D126 Benign neoplasm of colon, unspecified: Secondary | ICD-10-CM | POA: Diagnosis not present

## 2023-12-24 DIAGNOSIS — D692 Other nonthrombocytopenic purpura: Secondary | ICD-10-CM | POA: Diagnosis not present

## 2023-12-24 DIAGNOSIS — N183 Chronic kidney disease, stage 3 unspecified: Secondary | ICD-10-CM | POA: Diagnosis not present

## 2023-12-24 DIAGNOSIS — I10 Essential (primary) hypertension: Secondary | ICD-10-CM | POA: Diagnosis not present

## 2023-12-24 DIAGNOSIS — R609 Edema, unspecified: Secondary | ICD-10-CM | POA: Diagnosis not present

## 2023-12-24 DIAGNOSIS — Z299 Encounter for prophylactic measures, unspecified: Secondary | ICD-10-CM | POA: Diagnosis not present

## 2024-03-31 DIAGNOSIS — I10 Essential (primary) hypertension: Secondary | ICD-10-CM | POA: Diagnosis not present

## 2024-03-31 DIAGNOSIS — R110 Nausea: Secondary | ICD-10-CM | POA: Diagnosis not present

## 2024-03-31 DIAGNOSIS — Z0000 Encounter for general adult medical examination without abnormal findings: Secondary | ICD-10-CM | POA: Diagnosis not present

## 2024-03-31 DIAGNOSIS — Z299 Encounter for prophylactic measures, unspecified: Secondary | ICD-10-CM | POA: Diagnosis not present

## 2024-03-31 DIAGNOSIS — R350 Frequency of micturition: Secondary | ICD-10-CM | POA: Diagnosis not present

## 2024-04-13 DIAGNOSIS — N1832 Chronic kidney disease, stage 3b: Secondary | ICD-10-CM | POA: Diagnosis not present

## 2024-04-13 DIAGNOSIS — J449 Chronic obstructive pulmonary disease, unspecified: Secondary | ICD-10-CM | POA: Diagnosis not present

## 2024-04-13 DIAGNOSIS — K529 Noninfective gastroenteritis and colitis, unspecified: Secondary | ICD-10-CM | POA: Diagnosis not present

## 2024-04-26 DIAGNOSIS — N1832 Chronic kidney disease, stage 3b: Secondary | ICD-10-CM | POA: Diagnosis not present

## 2024-04-26 DIAGNOSIS — K529 Noninfective gastroenteritis and colitis, unspecified: Secondary | ICD-10-CM | POA: Diagnosis not present

## 2024-04-26 DIAGNOSIS — R52 Pain, unspecified: Secondary | ICD-10-CM | POA: Diagnosis not present

## 2024-04-26 DIAGNOSIS — I739 Peripheral vascular disease, unspecified: Secondary | ICD-10-CM | POA: Diagnosis not present

## 2024-04-26 DIAGNOSIS — I10 Essential (primary) hypertension: Secondary | ICD-10-CM | POA: Diagnosis not present

## 2024-04-26 DIAGNOSIS — Z299 Encounter for prophylactic measures, unspecified: Secondary | ICD-10-CM | POA: Diagnosis not present

## 2024-04-26 DIAGNOSIS — R109 Unspecified abdominal pain: Secondary | ICD-10-CM | POA: Diagnosis not present

## 2024-04-27 DIAGNOSIS — K6389 Other specified diseases of intestine: Secondary | ICD-10-CM | POA: Diagnosis not present

## 2024-04-27 DIAGNOSIS — R1011 Right upper quadrant pain: Secondary | ICD-10-CM | POA: Diagnosis not present

## 2024-04-27 DIAGNOSIS — I77811 Abdominal aortic ectasia: Secondary | ICD-10-CM | POA: Diagnosis not present

## 2024-04-27 DIAGNOSIS — R1031 Right lower quadrant pain: Secondary | ICD-10-CM | POA: Diagnosis not present

## 2024-04-27 DIAGNOSIS — I700 Atherosclerosis of aorta: Secondary | ICD-10-CM | POA: Diagnosis not present

## 2024-04-27 DIAGNOSIS — R933 Abnormal findings on diagnostic imaging of other parts of digestive tract: Secondary | ICD-10-CM | POA: Diagnosis not present

## 2024-05-03 DIAGNOSIS — I10 Essential (primary) hypertension: Secondary | ICD-10-CM | POA: Diagnosis not present

## 2024-05-03 DIAGNOSIS — J449 Chronic obstructive pulmonary disease, unspecified: Secondary | ICD-10-CM | POA: Diagnosis not present

## 2024-05-03 DIAGNOSIS — Z299 Encounter for prophylactic measures, unspecified: Secondary | ICD-10-CM | POA: Diagnosis not present

## 2024-05-03 DIAGNOSIS — K529 Noninfective gastroenteritis and colitis, unspecified: Secondary | ICD-10-CM | POA: Diagnosis not present

## 2024-05-18 DIAGNOSIS — R109 Unspecified abdominal pain: Secondary | ICD-10-CM | POA: Diagnosis not present

## 2024-05-18 DIAGNOSIS — Z299 Encounter for prophylactic measures, unspecified: Secondary | ICD-10-CM | POA: Diagnosis not present

## 2024-05-18 DIAGNOSIS — I10 Essential (primary) hypertension: Secondary | ICD-10-CM | POA: Diagnosis not present

## 2024-05-18 DIAGNOSIS — K589 Irritable bowel syndrome without diarrhea: Secondary | ICD-10-CM | POA: Diagnosis not present

## 2024-05-18 DIAGNOSIS — N1832 Chronic kidney disease, stage 3b: Secondary | ICD-10-CM | POA: Diagnosis not present

## 2024-05-18 DIAGNOSIS — M549 Dorsalgia, unspecified: Secondary | ICD-10-CM | POA: Diagnosis not present

## 2024-06-19 DIAGNOSIS — K589 Irritable bowel syndrome without diarrhea: Secondary | ICD-10-CM | POA: Diagnosis not present

## 2024-06-19 DIAGNOSIS — Z299 Encounter for prophylactic measures, unspecified: Secondary | ICD-10-CM | POA: Diagnosis not present

## 2024-06-19 DIAGNOSIS — R350 Frequency of micturition: Secondary | ICD-10-CM | POA: Diagnosis not present

## 2024-06-19 DIAGNOSIS — I10 Essential (primary) hypertension: Secondary | ICD-10-CM | POA: Diagnosis not present

## 2024-07-06 DIAGNOSIS — Z87891 Personal history of nicotine dependence: Secondary | ICD-10-CM | POA: Diagnosis not present

## 2024-07-06 DIAGNOSIS — I10 Essential (primary) hypertension: Secondary | ICD-10-CM | POA: Diagnosis not present

## 2024-07-06 DIAGNOSIS — S139XXA Sprain of joints and ligaments of unspecified parts of neck, initial encounter: Secondary | ICD-10-CM | POA: Diagnosis not present

## 2024-07-06 DIAGNOSIS — S0990XA Unspecified injury of head, initial encounter: Secondary | ICD-10-CM | POA: Diagnosis not present

## 2024-07-06 DIAGNOSIS — W01198A Fall on same level from slipping, tripping and stumbling with subsequent striking against other object, initial encounter: Secondary | ICD-10-CM | POA: Diagnosis not present

## 2024-07-06 DIAGNOSIS — G9389 Other specified disorders of brain: Secondary | ICD-10-CM | POA: Diagnosis not present

## 2024-07-07 DIAGNOSIS — I10 Essential (primary) hypertension: Secondary | ICD-10-CM | POA: Diagnosis not present

## 2024-07-07 DIAGNOSIS — I700 Atherosclerosis of aorta: Secondary | ICD-10-CM | POA: Diagnosis not present

## 2024-07-07 DIAGNOSIS — H8110 Benign paroxysmal vertigo, unspecified ear: Secondary | ICD-10-CM | POA: Diagnosis not present

## 2024-07-07 DIAGNOSIS — Z87891 Personal history of nicotine dependence: Secondary | ICD-10-CM | POA: Diagnosis not present

## 2024-07-07 DIAGNOSIS — I493 Ventricular premature depolarization: Secondary | ICD-10-CM | POA: Diagnosis not present

## 2024-07-07 DIAGNOSIS — Z79899 Other long term (current) drug therapy: Secondary | ICD-10-CM | POA: Diagnosis not present

## 2024-07-07 DIAGNOSIS — Z885 Allergy status to narcotic agent status: Secondary | ICD-10-CM | POA: Diagnosis not present

## 2024-08-15 DIAGNOSIS — I10 Essential (primary) hypertension: Secondary | ICD-10-CM | POA: Diagnosis not present

## 2024-08-15 DIAGNOSIS — Z7189 Other specified counseling: Secondary | ICD-10-CM | POA: Diagnosis not present

## 2024-08-15 DIAGNOSIS — S51012A Laceration without foreign body of left elbow, initial encounter: Secondary | ICD-10-CM | POA: Diagnosis not present

## 2024-08-15 DIAGNOSIS — M79622 Pain in left upper arm: Secondary | ICD-10-CM | POA: Diagnosis not present

## 2024-08-15 DIAGNOSIS — M47812 Spondylosis without myelopathy or radiculopathy, cervical region: Secondary | ICD-10-CM | POA: Diagnosis not present

## 2024-08-15 DIAGNOSIS — M19012 Primary osteoarthritis, left shoulder: Secondary | ICD-10-CM | POA: Diagnosis not present

## 2024-08-15 DIAGNOSIS — S299XXA Unspecified injury of thorax, initial encounter: Secondary | ICD-10-CM | POA: Diagnosis not present

## 2024-08-15 DIAGNOSIS — M4802 Spinal stenosis, cervical region: Secondary | ICD-10-CM | POA: Diagnosis not present

## 2024-08-15 DIAGNOSIS — Z0000 Encounter for general adult medical examination without abnormal findings: Secondary | ICD-10-CM | POA: Diagnosis not present

## 2024-08-15 DIAGNOSIS — Z299 Encounter for prophylactic measures, unspecified: Secondary | ICD-10-CM | POA: Diagnosis not present

## 2024-08-15 DIAGNOSIS — M79632 Pain in left forearm: Secondary | ICD-10-CM | POA: Diagnosis not present

## 2024-08-15 DIAGNOSIS — S0990XA Unspecified injury of head, initial encounter: Secondary | ICD-10-CM | POA: Diagnosis not present

## 2024-08-15 DIAGNOSIS — N390 Urinary tract infection, site not specified: Secondary | ICD-10-CM | POA: Diagnosis not present

## 2024-08-15 DIAGNOSIS — S3991XA Unspecified injury of abdomen, initial encounter: Secondary | ICD-10-CM | POA: Diagnosis not present

## 2024-08-15 DIAGNOSIS — S3993XA Unspecified injury of pelvis, initial encounter: Secondary | ICD-10-CM | POA: Diagnosis not present

## 2024-08-15 DIAGNOSIS — Z79899 Other long term (current) drug therapy: Secondary | ICD-10-CM | POA: Diagnosis not present

## 2024-08-15 DIAGNOSIS — W130XXA Fall from, out of or through balcony, initial encounter: Secondary | ICD-10-CM | POA: Diagnosis not present

## 2024-08-15 DIAGNOSIS — I350 Nonrheumatic aortic (valve) stenosis: Secondary | ICD-10-CM | POA: Diagnosis not present

## 2024-08-15 DIAGNOSIS — R5383 Other fatigue: Secondary | ICD-10-CM | POA: Diagnosis not present

## 2024-08-15 DIAGNOSIS — S199XXA Unspecified injury of neck, initial encounter: Secondary | ICD-10-CM | POA: Diagnosis not present

## 2024-08-15 DIAGNOSIS — E7800 Pure hypercholesterolemia, unspecified: Secondary | ICD-10-CM | POA: Diagnosis not present

## 2024-08-15 DIAGNOSIS — I700 Atherosclerosis of aorta: Secondary | ICD-10-CM | POA: Diagnosis not present

## 2024-08-15 DIAGNOSIS — S50812A Abrasion of left forearm, initial encounter: Secondary | ICD-10-CM | POA: Diagnosis not present

## 2024-08-15 DIAGNOSIS — R079 Chest pain, unspecified: Secondary | ICD-10-CM | POA: Diagnosis not present

## 2024-08-15 DIAGNOSIS — Z1389 Encounter for screening for other disorder: Secondary | ICD-10-CM | POA: Diagnosis not present

## 2024-08-15 DIAGNOSIS — S4992XA Unspecified injury of left shoulder and upper arm, initial encounter: Secondary | ICD-10-CM | POA: Diagnosis not present

## 2024-08-17 DIAGNOSIS — S40022S Contusion of left upper arm, sequela: Secondary | ICD-10-CM | POA: Diagnosis not present

## 2024-08-17 DIAGNOSIS — I10 Essential (primary) hypertension: Secondary | ICD-10-CM | POA: Diagnosis not present

## 2024-08-17 DIAGNOSIS — W19XXXA Unspecified fall, initial encounter: Secondary | ICD-10-CM | POA: Diagnosis not present

## 2024-08-17 DIAGNOSIS — Z299 Encounter for prophylactic measures, unspecified: Secondary | ICD-10-CM | POA: Diagnosis not present

## 2024-08-17 DIAGNOSIS — J449 Chronic obstructive pulmonary disease, unspecified: Secondary | ICD-10-CM | POA: Diagnosis not present

## 2024-08-21 DIAGNOSIS — Z23 Encounter for immunization: Secondary | ICD-10-CM | POA: Diagnosis not present

## 2024-08-30 DIAGNOSIS — S40022S Contusion of left upper arm, sequela: Secondary | ICD-10-CM | POA: Diagnosis not present

## 2024-08-30 DIAGNOSIS — I10 Essential (primary) hypertension: Secondary | ICD-10-CM | POA: Diagnosis not present

## 2024-08-30 DIAGNOSIS — N390 Urinary tract infection, site not specified: Secondary | ICD-10-CM | POA: Diagnosis not present

## 2024-08-30 DIAGNOSIS — J449 Chronic obstructive pulmonary disease, unspecified: Secondary | ICD-10-CM | POA: Diagnosis not present

## 2024-08-30 DIAGNOSIS — N183 Chronic kidney disease, stage 3 unspecified: Secondary | ICD-10-CM | POA: Diagnosis not present

## 2024-08-30 DIAGNOSIS — Z299 Encounter for prophylactic measures, unspecified: Secondary | ICD-10-CM | POA: Diagnosis not present

## 2024-09-06 DIAGNOSIS — S51812A Laceration without foreign body of left forearm, initial encounter: Secondary | ICD-10-CM | POA: Diagnosis not present

## 2024-09-06 DIAGNOSIS — Z299 Encounter for prophylactic measures, unspecified: Secondary | ICD-10-CM | POA: Diagnosis not present

## 2024-09-06 DIAGNOSIS — S40022S Contusion of left upper arm, sequela: Secondary | ICD-10-CM | POA: Diagnosis not present

## 2024-09-06 DIAGNOSIS — I10 Essential (primary) hypertension: Secondary | ICD-10-CM | POA: Diagnosis not present

## 2024-09-09 ENCOUNTER — Other Ambulatory Visit: Payer: Self-pay | Admitting: Cardiology

## 2024-09-19 DIAGNOSIS — I10 Essential (primary) hypertension: Secondary | ICD-10-CM | POA: Diagnosis not present

## 2024-09-19 DIAGNOSIS — N1832 Chronic kidney disease, stage 3b: Secondary | ICD-10-CM | POA: Diagnosis not present

## 2024-09-19 DIAGNOSIS — Z299 Encounter for prophylactic measures, unspecified: Secondary | ICD-10-CM | POA: Diagnosis not present

## 2024-09-21 DIAGNOSIS — H40033 Anatomical narrow angle, bilateral: Secondary | ICD-10-CM | POA: Diagnosis not present

## 2024-09-21 DIAGNOSIS — H2513 Age-related nuclear cataract, bilateral: Secondary | ICD-10-CM | POA: Diagnosis not present
